# Patient Record
Sex: Female | Born: 1945 | Race: White | Hispanic: No | Smoking: Former smoker
Health system: Southern US, Community
[De-identification: ages and names within clinical notes are randomized; demographics above are authoritative.]

## PROBLEM LIST (undated history)

## (undated) DIAGNOSIS — N814 Uterovaginal prolapse, unspecified: Secondary | ICD-10-CM

## (undated) DIAGNOSIS — R42 Dizziness and giddiness: Secondary | ICD-10-CM

## (undated) DIAGNOSIS — E119 Type 2 diabetes mellitus without complications: Secondary | ICD-10-CM

## (undated) DIAGNOSIS — H8109 Meniere's disease, unspecified ear: Secondary | ICD-10-CM

## (undated) DIAGNOSIS — R413 Other amnesia: Secondary | ICD-10-CM

## (undated) DIAGNOSIS — H353290 Exudative age-related macular degeneration, unspecified eye, stage unspecified: Secondary | ICD-10-CM

## (undated) DIAGNOSIS — F419 Anxiety disorder, unspecified: Secondary | ICD-10-CM

## (undated) DIAGNOSIS — E559 Vitamin D deficiency, unspecified: Secondary | ICD-10-CM

## (undated) DIAGNOSIS — F32A Depression, unspecified: Secondary | ICD-10-CM

## (undated) DIAGNOSIS — F0390 Unspecified dementia without behavioral disturbance: Secondary | ICD-10-CM

## (undated) DIAGNOSIS — E539 Vitamin B deficiency, unspecified: Secondary | ICD-10-CM

## (undated) DIAGNOSIS — Z8639 Personal history of other endocrine, nutritional and metabolic disease: Secondary | ICD-10-CM

## (undated) DIAGNOSIS — I959 Hypotension, unspecified: Secondary | ICD-10-CM

## (undated) DIAGNOSIS — F609 Personality disorder, unspecified: Secondary | ICD-10-CM

## (undated) DIAGNOSIS — M810 Age-related osteoporosis without current pathological fracture: Secondary | ICD-10-CM

## (undated) DIAGNOSIS — Z87440 Personal history of urinary (tract) infections: Secondary | ICD-10-CM

## (undated) DIAGNOSIS — G319 Degenerative disease of nervous system, unspecified: Secondary | ICD-10-CM

## (undated) DIAGNOSIS — I739 Peripheral vascular disease, unspecified: Secondary | ICD-10-CM

## (undated) HISTORY — DX: Age-related osteoporosis without current pathological fracture: M81.0

## (undated) HISTORY — DX: Vitamin B deficiency, unspecified: E53.9

## (undated) HISTORY — DX: Exudative age-related macular degeneration, unspecified eye, stage unspecified: H35.3290

## (undated) HISTORY — DX: Unspecified dementia, unspecified severity, without behavioral disturbance, psychotic disturbance, mood disturbance, and anxiety: F03.90

## (undated) HISTORY — DX: Anxiety disorder, unspecified: F41.9

## (undated) HISTORY — DX: Vitamin D deficiency, unspecified: E55.9

## (undated) HISTORY — DX: Hypotension, unspecified: I95.9

## (undated) HISTORY — DX: Personal history of other endocrine, nutritional and metabolic disease: Z86.39

## (undated) HISTORY — DX: Personality disorder, unspecified: F60.9

## (undated) HISTORY — DX: Type 2 diabetes mellitus without complications: E11.9

## (undated) HISTORY — DX: Degenerative disease of nervous system, unspecified: G31.9

## (undated) HISTORY — DX: Meniere's disease, unspecified ear: H81.09

## (undated) HISTORY — DX: Dizziness and giddiness: R42

## (undated) HISTORY — PX: GASTRIC BYPASS: SHX52

## (undated) HISTORY — DX: Other amnesia: R41.3

## (undated) HISTORY — DX: Peripheral vascular disease, unspecified: I73.9

## (undated) HISTORY — DX: Personal history of urinary (tract) infections: Z87.440

## (undated) HISTORY — DX: Depression, unspecified: F32.A

---

## 1978-11-15 HISTORY — PX: VAGINAL HYSTERECTOMY: SUR661

## 1980-11-15 HISTORY — PX: GALLBLADDER SURGERY: SHX652

## 2017-05-28 DIAGNOSIS — H35359 Cystoid macular degeneration, unspecified eye: Secondary | ICD-10-CM | POA: Diagnosis not present

## 2017-11-25 DIAGNOSIS — M170 Bilateral primary osteoarthritis of knee: Secondary | ICD-10-CM | POA: Diagnosis not present

## 2018-02-12 ENCOUNTER — Emergency Department (HOSPITAL_COMMUNITY): Payer: Medicare Other

## 2018-02-12 ENCOUNTER — Encounter (HOSPITAL_COMMUNITY): Payer: Self-pay | Admitting: Emergency Medicine

## 2018-02-12 ENCOUNTER — Emergency Department (HOSPITAL_COMMUNITY)
Admission: EM | Admit: 2018-02-12 | Discharge: 2018-02-13 | Disposition: A | Discharge status: Home / Self Care | Payer: Medicare Other | Attending: Emergency Medicine | Admitting: Emergency Medicine

## 2018-02-12 DIAGNOSIS — R102 Pelvic and perineal pain: Secondary | ICD-10-CM | POA: Diagnosis not present

## 2018-02-12 DIAGNOSIS — F17210 Nicotine dependence, cigarettes, uncomplicated: Secondary | ICD-10-CM | POA: Insufficient documentation

## 2018-02-12 DIAGNOSIS — N952 Postmenopausal atrophic vaginitis: Secondary | ICD-10-CM | POA: Diagnosis not present

## 2018-02-12 DIAGNOSIS — N938 Other specified abnormal uterine and vaginal bleeding: Secondary | ICD-10-CM | POA: Diagnosis not present

## 2018-02-12 DIAGNOSIS — N939 Abnormal uterine and vaginal bleeding, unspecified: Secondary | ICD-10-CM

## 2018-02-12 HISTORY — DX: Uterovaginal prolapse, unspecified: N81.4

## 2018-02-12 LAB — COMPREHENSIVE METABOLIC PANEL
ALT: 14 U/L (ref 14–54)
AST: 22 U/L (ref 15–41)
Albumin: 3.3 g/dL — ABNORMAL LOW (ref 3.5–5.0)
Alkaline Phosphatase: 73 U/L (ref 38–126)
Anion gap: 13 (ref 5–15)
BUN: 12 mg/dL (ref 6–20)
CHLORIDE: 106 mmol/L (ref 101–111)
CO2: 20 mmol/L — AB (ref 22–32)
Calcium: 8.4 mg/dL — ABNORMAL LOW (ref 8.9–10.3)
Creatinine, Ser: 0.98 mg/dL (ref 0.44–1.00)
GFR, EST NON AFRICAN AMERICAN: 57 mL/min — AB (ref >60)
Glucose, Bld: 107 mg/dL — ABNORMAL HIGH (ref 65–99)
Potassium: 4.1 mmol/L (ref 3.5–5.1)
SODIUM: 139 mmol/L (ref 135–145)
Total Bilirubin: 0.6 mg/dL (ref 0.3–1.2)
Total Protein: 6.5 g/dL (ref 6.5–8.1)

## 2018-02-12 LAB — CBC
HCT: 36.9 % (ref 36.0–46.0)
Hemoglobin: 11.6 g/dL — ABNORMAL LOW (ref 12.0–15.0)
MCH: 29.4 pg (ref 26.0–34.0)
MCHC: 31.4 g/dL (ref 30.0–36.0)
MCV: 93.7 fL (ref 78.0–100.0)
PLATELETS: 232 10*3/uL (ref 150–400)
RBC: 3.94 MIL/uL (ref 3.87–5.11)
RDW: 15.5 % (ref 11.5–15.5)
WBC: 7.2 10*3/uL (ref 4.0–10.5)

## 2018-02-12 NOTE — ED Provider Notes (Signed)
[facility] DEPART[facility] Provider Note   CSN: [id number] Arrival date & time: 02/12/18  1714     History   Chief Complaint Chief Complaint  Patient presents with  . Vaginal Bleeding    HPI Joanne Jenkins is a 72 y.o. female.  Patient is a 72 year old female with past medical history of uterine prolapse with surgical correction in the past.  She presents today for evaluation of vaginal bleeding.  This started 2-1/2 days ago and is gradually worsening.  She does report some pressure in her lower abdomen but denies any dysuria or bowel complaints.  She denies any fevers or chills.  She denies any injury or trauma.  She reports going through 2 pads per day.  She describes her bleeding as "like a period".  She has been menopausal since her late 43s.  The history is provided by the patient.  Vaginal Bleeding  Primary symptoms include pelvic pain, vaginal bleeding.  Primary symptoms include no discharge. There has been no fever. This is a new problem. The current episode started 2 days ago. The problem occurs constantly. The problem has been gradually worsening. She has never menstruated. She has tried nothing for the symptoms.    Past Medical History:  Diagnosis Date  . Prolapsed uterus     There are no active problems to display for this patient.   History reviewed. No pertinent surgical history.   OB History   None      Home Medications    Prior to Admission medications   Not on File    Family History No family history on file.  Social History Social History   Tobacco Use  . Smoking status: Current Every Day Smoker    Packs/day: 0.30    Types: Cigarettes  . Smokeless tobacco: Never Used  Substance Use Topics  . Alcohol use: Yes    Comment: occ  . Drug use: Never     Allergies   Patient has no known allergies.   Review of Systems Review of Systems  Genitourinary: Positive for pelvic pain and vaginal bleeding.  All other  systems reviewed and are negative.    Physical Exam Updated Vital Signs BP (!) 148/78 (BP Location: Right Arm)   Pulse 89   Temp 98.2 F (36.8 C) (Oral)   Resp 12   Ht 5\' 9"  (1.753 m)   Wt 77.1 kg (170 lb)   SpO2 98%   BMI 25.10 kg/m   Physical Exam  Constitutional: She is oriented to person, place, and time. She appears well-developed and well-nourished. No distress.  HENT:  Head: Normocephalic and atraumatic.  Neck: Normal range of motion. Neck supple.  Cardiovascular: Normal rate and regular rhythm. Exam reveals no gallop and no friction rub.  No murmur heard. Pulmonary/Chest: Effort normal and breath sounds normal. No respiratory distress. She has no wheezes.  Abdominal: Soft. Bowel sounds are normal. She exhibits no distension and no mass. There is tenderness. There is no rebound and no guarding.  There is mild tenderness in the suprapubic region.  Musculoskeletal: Normal range of motion.  Neurological: She is alert and oriented to person, place, and time.  Skin: Skin is warm and dry. She is not diaphoretic.  Nursing note and vitals reviewed.    ED Treatments / Results  Labs (all labs ordered are listed, but only abnormal results are displayed) Labs Reviewed  CBC  COMPREHENSIVE METABOLIC PANEL    EKG None  Radiology No results found.  Procedures  Procedures (including critical care time)  Medications Ordered in ED Medications - No data to display   Initial Impression / Assessment and Plan / ED Course  I have reviewed the triage vital signs and the nursing notes.  Pertinent labs & imaging results that were available during my care of the patient were reviewed by me and considered in my medical decision making (see chart for details).  Patient presents here with complaints of vaginal bleeding.  Her surgical history is somewhat unclear as the patient is on sure as to what she has had done in the past.  She does report having uterine prolapse and some sort  of procedure to correct this.  Her ultrasound today fails to reveal a definite uterus and pelvic examination with speculum shows no cervix.  She does have friable, bleeding tissue to the inside of the vagina.  This will be treated with premarin cream as per Dr. Johnnye Sima recommendations.  Final Clinical Impressions(s) / ED Diagnoses   Final diagnoses:  None    ED Discharge Orders    None       Veryl Speak, MD 02/13/18 [phone]

## 2018-02-12 NOTE — ED Triage Notes (Addendum)
Pt to ED c/o vaginal bleeding x 2.5 days, going through 2 maxi-pads per day with lower abdominal cramping. Hx uterine prolapse and surgery to fix it. No other significant medical hx. Endorses lightheadedness if standing too fast.

## 2018-02-13 ENCOUNTER — Emergency Department (HOSPITAL_COMMUNITY): Payer: Medicare Other

## 2018-02-13 DIAGNOSIS — N939 Abnormal uterine and vaginal bleeding, unspecified: Secondary | ICD-10-CM | POA: Diagnosis not present

## 2018-02-13 MED ORDER — ESTROGENS, CONJUGATED 0.625 MG/GM VA CREA: 1.0000 | TOPICAL_CREAM | VAGINAL | 1 refills | Status: DC

## 2018-02-13 NOTE — Discharge Instructions (Signed)
Premarin cream.  Apply this intravaginally twice weekly.  Follow-up with GYN in the next 1-2 weeks.

## 2018-02-13 NOTE — ED Notes (Signed)
PT states understanding of care given, follow up care, and medication prescribed. PT ambulated from ED to car with a steady gait. 

## 2018-02-17 ENCOUNTER — Encounter: Payer: Medicare Other | Admitting: Obstetrics & Gynecology

## 2018-02-28 ENCOUNTER — Encounter: Payer: Self-pay | Admitting: Obstetrics & Gynecology

## 2018-02-28 ENCOUNTER — Ambulatory Visit (INDEPENDENT_AMBULATORY_CARE_PROVIDER_SITE_OTHER): Payer: Medicare Other | Admitting: Obstetrics & Gynecology

## 2018-02-28 VITALS — BP 146/83 | HR 93 | Ht 69.0 in | Wt 199.0 lb

## 2018-02-28 DIAGNOSIS — N952 Postmenopausal atrophic vaginitis: Secondary | ICD-10-CM

## 2018-02-28 DIAGNOSIS — N949 Unspecified condition associated with female genital organs and menstrual cycle: Secondary | ICD-10-CM

## 2018-02-28 NOTE — Progress Notes (Signed)
   Subjective:    Patient ID: Joanne Jenkins, female    DOB: 1946/05/24, 72 y.o.   MRN: [id number]  HPI  72 yo lady here with the issue of vaginal discomfort. She was prescribed vaginal estrogen after an ER visit, but she only tried it for 2 doses. Her son was worried that she would become a "bitch" if she started using estrogen.  She was seen at the ER for vaginal bleeding. She had a TVH for prolapse (her son weighed 13 pounds at birth).   Review of Systems     Objective:   Physical Exam  Breathing, conversing, and ambulating normally Well nourished, well hydrated White female, no apparent distress Abd- benign, excellent cosmetic results after a abdominoplasty (had gastric bypass and lost 200 pounds) Vagina very atrophic, some urethral prolapse (I suspect this is where the blood came from)      Assessment & Plan:  Vaginal discomfort from vaginal atrophy- recommend that she restart the vaginal estrogen 2-3 days a week Come back 4 weeks

## 2018-05-19 ENCOUNTER — Encounter: Payer: Self-pay | Admitting: Family Medicine

## 2018-05-19 ENCOUNTER — Ambulatory Visit (INDEPENDENT_AMBULATORY_CARE_PROVIDER_SITE_OTHER): Payer: Medicare Other | Admitting: Family Medicine

## 2018-05-19 VITALS — BP 122/68 | HR 78 | Temp 98.8°F | Ht 67.0 in | Wt 195.5 lb

## 2018-05-19 DIAGNOSIS — R42 Dizziness and giddiness: Secondary | ICD-10-CM | POA: Diagnosis not present

## 2018-05-19 DIAGNOSIS — Z8669 Personal history of other diseases of the nervous system and sense organs: Secondary | ICD-10-CM

## 2018-05-19 DIAGNOSIS — Z7689 Persons encountering health services in other specified circumstances: Secondary | ICD-10-CM

## 2018-05-19 MED ORDER — MECLIZINE HCL 25 MG PO TABS: 25.0000 mg | ORAL_TABLET | Freq: Three times a day (TID) | ORAL | 0 refills | Status: DC | PRN

## 2018-05-19 NOTE — Patient Instructions (Addendum)
Please call and schedule an appointment for screening mammogram. A referral is not needed.  [phone]  Stop at desk for referral to eye specialist

## 2018-05-19 NOTE — Progress Notes (Signed)
Subjective:    Patient ID: Joanne Jenkins, female    DOB: 03/14/1946, 72 y.o.   MRN: [id number]  HPI This is a 72 yo female who presents today to establish care. Moved here from [place], [place] to be closer to her son.  Has a new puppy.  Enjoys socializing with friends.  Doesn't drive much any more.   Last CPE- not sure  Mammo- not in long time Pap- hysterectomy Colonoscopy- never Tdap- unsure Flu- not ususally Eye- over due Exercise- walking  Sleep- sleeps well, good energy  Inner ear- has been taking antivert off and on for several years, has had same bottle for 4 years. Rare episodes, feels lightheaded, relieved with anitvert. No side effects.   No chest pain, no SOB, regular bowel movements, no dysuria, no frequency, no nocturia.   Has history of wet macular degeneration. Needs optho referral.   Was seen by gyn for vaginal bleeding, thought to be related to vaginal atrophy, was prescribed estrogen cream but did not get.   Son present for end of visit. He expresses concern about his mother's memory.   Past Medical History:  Diagnosis Date  . History of diabetes mellitus    resolved with weight loss  . History of UTI   . Prolapsed uterus    Past Surgical History:  Procedure Laterality Date  . CESAREAN SECTION  1980  . [place]  . GASTRIC BYPASS    . VAGINAL HYSTERECTOMY  1980   Family History  Problem Relation Age of Onset  . Diabetes Mother   . Arthritis Mother   . Heart defect Mother   . Heart disease Mother   . Drug abuse Brother   . Diabetes Brother   . Heart defect Brother   . Heart disease Brother    Social History   Tobacco Use  . Smoking status: Current Every Day Smoker    Packs/day: 0.30    Types: Cigarettes  . Smokeless tobacco: Never Used  Substance Use Topics  . Alcohol use: Yes    Comment: occ  . Drug use: Never      Review of Systems Per HPI    Objective:   Physical Exam Physical Exam  Constitutional:  Oriented to person, place, and time. She appears well-developed and well-nourished.  HENT:  Head: Normocephalic and atraumatic.  Eyes: Conjunctivae are normal.  Neck: Normal range of motion. Neck supple.  Cardiovascular: Normal rate, regular rhythm and normal heart sounds.   Pulmonary/Chest: Effort normal and breath sounds normal.  Musculoskeletal: No edema. Normal gait.  Neurological: Alert and oriented to person, place, and time.  Skin: Skin is warm and dry.  Psychiatric: Normal mood and affect. Behavior is normal. Judgment and thought content normal.  Vitals reviewed.     BP 122/68 (BP Location: Left Arm, Patient Position: Sitting, Cuff Size: Large)   Pulse 78   Temp 98.8 F (37.1 C) (Oral)   Ht 5\' 7"  (1.702 m)   Wt 195 lb 8 oz (88.7 kg)   SpO2 97%   BMI 30.62 kg/m  Wt Readings from Last 3 Encounters:  05/19/18 195 lb 8 oz (88.7 kg)  02/28/18 199 lb (90.3 kg)  02/12/18 170 lb (77.1 kg)       Assessment & Plan:  1. Encounter to establish care - will request records - follow up in 3 months, schedule AWV  2. Lightheadedness - episodes rare, discussed potential side effects of meclizine and RTC precuations - meclizine (ANTIVERT) 25  MG tablet; Take 1 tablet (25 mg total) by mouth 3 (three) times daily as needed for dizziness.  Dispense: 30 tablet; Refill: 0  3. History of macular degeneration - Ambulatory referral to Ophthalmology   Clarene Reamer, FNP-BC  [facility]  05/23/2018 4:42 PM

## 2018-05-23 DIAGNOSIS — Z8669 Personal history of other diseases of the nervous system and sense organs: Secondary | ICD-10-CM | POA: Insufficient documentation

## 2018-05-23 DIAGNOSIS — R42 Dizziness and giddiness: Secondary | ICD-10-CM | POA: Insufficient documentation

## 2018-07-13 DIAGNOSIS — H353132 Nonexudative age-related macular degeneration, bilateral, intermediate dry stage: Secondary | ICD-10-CM | POA: Diagnosis not present

## 2018-07-19 ENCOUNTER — Other Ambulatory Visit: Payer: Self-pay | Admitting: Family Medicine

## 2018-07-19 DIAGNOSIS — E669 Obesity, unspecified: Secondary | ICD-10-CM

## 2018-07-19 DIAGNOSIS — R7309 Other abnormal glucose: Secondary | ICD-10-CM

## 2018-07-19 NOTE — Progress Notes (Signed)
Lab orders placed.  

## 2018-07-20 ENCOUNTER — Ambulatory Visit (INDEPENDENT_AMBULATORY_CARE_PROVIDER_SITE_OTHER): Payer: Medicare Other

## 2018-07-20 VITALS — BP 124/90 | HR 79 | Temp 98.8°F | Ht 67.5 in | Wt 196.5 lb

## 2018-07-20 DIAGNOSIS — Z23 Encounter for immunization: Secondary | ICD-10-CM

## 2018-07-20 DIAGNOSIS — E669 Obesity, unspecified: Secondary | ICD-10-CM | POA: Diagnosis not present

## 2018-07-20 DIAGNOSIS — Z0000 Encounter for general adult medical examination without abnormal findings: Secondary | ICD-10-CM

## 2018-07-20 DIAGNOSIS — Z1159 Encounter for screening for other viral diseases: Secondary | ICD-10-CM | POA: Diagnosis not present

## 2018-07-20 DIAGNOSIS — R7309 Other abnormal glucose: Secondary | ICD-10-CM | POA: Diagnosis not present

## 2018-07-20 LAB — LIPID PANEL
CHOL/HDL RATIO: 3
Cholesterol: 150 mg/dL (ref 0–200)
HDL: 48.4 mg/dL (ref >39.00)
LDL CALC: 79 mg/dL (ref 0–99)
NonHDL: 101.71
TRIGLYCERIDES: 112 mg/dL (ref 0.0–149.0)
VLDL: 22.4 mg/dL (ref 0.0–40.0)

## 2018-07-20 LAB — HEMOGLOBIN A1C: Hgb A1c MFr Bld: 6.5 % (ref 4.6–6.5)

## 2018-07-20 NOTE — Progress Notes (Signed)
PCP notes:   Health maintenance:  Flu vaccine - administered Bone density - PCP follow-up needed Mammogram - PCP follow-up needed Colon cancer screening - PCP follow-up needed Tetanus vaccine - addressed PPSV23 - pt declined Hep C screening - completed  Abnormal screenings:   Mini-Cog score: 17/20 MMSE - Mini Mental State Exam 07/20/2018  Orientation to time 5  Orientation to Place 5  Registration 3  Attention/ Calculation 0  Recall 0  Recall-comments unable to recall 3 of 3 words  Language- name 2 objects 0  Language- repeat 1  Language- follow 3 step command 3  Language- read & follow direction 0  Write a sentence 0  Copy design 0  Total score 17     Patient concerns:   None  Nurse concerns:  None  Next PCP appt:   07/24/18 @ 830

## 2018-07-20 NOTE — Patient Instructions (Addendum)
Joanne Jenkins , Thank you for taking time to come for your Medicare Wellness Visit. I appreciate your ongoing commitment to your health goals. Please review the following plan we discussed and let me know if I can assist you in the future.   These are the goals we discussed: Goals    . Patient Stated     Starting 07/20/2018, I will continue to walk at least 60-90 minutes 3 days per week.        This is a list of the screening recommended for you and due dates:  Health Maintenance  Topic Date Due  . DEXA scan (bone density measurement)  02/14/2019*  . Mammogram  07/21/2019*  . Colon Cancer Screening  07/21/2019*  . Tetanus Vaccine  07/21/2019*  . Pneumonia vaccines (1 of 2 - PCV13) 07/21/2019*  . Flu Shot  Completed  .  Hepatitis C: One time screening is recommended by Center for Disease Control  (CDC) for  adults born from [birth year] through 1964.   Completed  *Topic was postponed. The date shown is not the original due date.    Preventive Care for Adults  A healthy lifestyle and preventive care can promote health and wellness. Preventive health guidelines for adults include the following key practices.  . A routine yearly physical is a good way to check with your health care provider about your health and preventive screening. It is a chance to share any concerns and updates on your health and to receive a thorough exam.  . Visit your dentist for a routine exam and preventive care every 6 months. Brush your teeth twice a day and floss once a day. Good oral hygiene prevents tooth decay and gum disease.  . The frequency of eye exams is based on your age, health, family medical history, use  of contact lenses, and other factors. Follow your health care provider's recommendations for frequency of eye exams.  . Eat a healthy diet. Foods like vegetables, fruits, whole grains, low-fat dairy products, and lean protein foods contain the nutrients you need without too many calories. Decrease your  intake of foods high in solid fats, added sugars, and salt. Eat the right amount of calories for you. Get information about a proper diet from your health care provider, if necessary.  . Regular physical exercise is one of the most important things you can do for your health. Most adults should get at least 150 minutes of moderate-intensity exercise (any activity that increases your heart rate and causes you to sweat) each week. In addition, most adults need muscle-strengthening exercises on 2 or more days a week.  Silver Sneakers may be a benefit available to you. To determine eligibility, you may visit the website: www.silversneakers.com or contact program at 6133919494 Mon-Fri between 8AM-8PM.   . Maintain a healthy weight. The body mass index (BMI) is a screening tool to identify possible weight problems. It provides an estimate of body fat based on height and weight. Your health care provider can find your BMI and can help you achieve or maintain a healthy weight.   For adults 20 years and older: ? A BMI below 18.5 is considered underweight. ? A BMI of 18.5 to 24.9 is normal. ? A BMI of 25 to 29.9 is considered overweight. ? A BMI of 30 and above is considered obese.   . Maintain normal blood lipids and cholesterol levels by exercising and minimizing your intake of saturated fat. Eat a balanced diet with plenty of fruit and  vegetables. Blood tests for lipids and cholesterol should begin at age 32 and be repeated every 5 years. If your lipid or cholesterol levels are high, you are over 50, or you are at high risk for heart disease, you may need your cholesterol levels checked more frequently. Ongoing high lipid and cholesterol levels should be treated with medicines if diet and exercise are not working.  . If you smoke, find out from your health care provider how to quit. If you do not use tobacco, please do not start.  . If you choose to drink alcohol, please do not consume more than 2  drinks per day. One drink is considered to be 12 ounces (355 mL) of beer, 5 ounces (148 mL) of wine, or 1.5 ounces (44 mL) of liquor.  . If you are 47-89 years old, ask your health care provider if you should take aspirin to prevent strokes.  . Use sunscreen. Apply sunscreen liberally and repeatedly throughout the day. You should seek shade when your shadow is shorter than you. Protect yourself by wearing long sleeves, pants, a wide-brimmed hat, and sunglasses year round, whenever you are outdoors.  . Once a month, do a whole body skin exam, using a mirror to look at the skin on your back. Tell your health care provider of new moles, moles that have irregular borders, moles that are larger than a pencil eraser, or moles that have changed in shape or color.

## 2018-07-20 NOTE — Progress Notes (Signed)
Subjective:   Joanne Jenkins is a 72 y.o. female who presents for an Initial Medicare Annual Wellness Visit.  Review of Systems    N/A  Cardiac Risk Factors include: advanced age (>66men, >87 women);obesity (BMI >30kg/m2);smoking/ tobacco exposure     Objective:    Today's Vitals   07/20/18 0843  BP: 124/90  Pulse: 79  Temp: 98.8 F (37.1 C)  TempSrc: Oral  SpO2: 99%  Weight: 196 lb 8 oz (89.1 kg)  Height: 5' 7.5" (1.715 m)  PainSc: 0-No pain   Body mass index is 30.32 kg/m.  Advanced Directives 07/20/2018  Does Patient Have a Medical Advance Directive? No  Would patient like information on creating a medical advance directive? Yes (MAU/Ambulatory/Procedural Areas - Information given)    Current Medications (verified) Outpatient Encounter Medications as of 07/20/2018  Medication Sig  . meclizine (ANTIVERT) 25 MG tablet Take 1 tablet (25 mg total) by mouth 3 (three) times daily as needed for dizziness.   No facility-administered encounter medications on file as of 07/20/2018.     Allergies (verified) Patient has no known allergies.   History: Past Medical History:  Diagnosis Date  . History of diabetes mellitus    resolved with weight loss  . History of UTI   . Prolapsed uterus    Past Surgical History:  Procedure Laterality Date  . CESAREAN SECTION  1980  . [place]  . GASTRIC BYPASS    . VAGINAL HYSTERECTOMY  1980   Family History  Problem Relation Age of Onset  . Diabetes Mother   . Arthritis Mother   . Heart defect Mother   . Heart disease Mother   . Drug abuse Brother   . Diabetes Brother   . Heart defect Brother   . Heart disease Brother    Social History   Socioeconomic History  . Marital status: Single    Spouse name: Not on file  . Number of children: Not on file  . Years of education: Not on file  . Highest education level: Not on file  Occupational History  . Not on file  Social Needs  . Financial resource  strain: Not on file  . Food insecurity:    Worry: Not on file    Inability: Not on file  . Transportation needs:    Medical: Not on file    Non-medical: Not on file  Tobacco Use  . Smoking status: Current Every Day Smoker    Packs/day: 0.30    Types: Cigarettes  . Smokeless tobacco: Never Used  Substance and Sexual Activity  . Alcohol use: Yes    Comment: occ  . Drug use: Never  . Sexual activity: Not Currently  Lifestyle  . Physical activity:    Days per week: Not on file    Minutes per session: Not on file  . Stress: Not on file  Relationships  . Social connections:    Talks on phone: Not on file    Gets together: Not on file    Attends religious service: Not on file    Active member of club or organization: Not on file    Attends meetings of clubs or organizations: Not on file    Relationship status: Not on file  Other Topics Concern  . Not on file  Social History Narrative  . Not on file    Tobacco Counseling Ready to quit: No Counseling given: No   Clinical Intake:  Pre-visit preparation completed: Yes  Pain :  No/denies pain Pain Score: 0-No pain     Nutritional Status: BMI > 30  Obese Nutritional Risks: None Diabetes: No  How often do you need to have someone help you when you read instructions, pamphlets, or other written materials from your doctor or pharmacy?: 1 - Never What is the last grade level you completed in school?: 12th grade + some college courses  Interpreter Needed?: No  Comments: pt is divorced and lives with son Information entered by :: [name], LPN   Activities of Daily Living In your present state of health, do you have any difficulty performing the following activities: 07/20/2018  Hearing? N  Vision? N  Difficulty concentrating or making decisions? N  Walking or climbing stairs? N  Dressing or bathing? N  Doing errands, shopping? Y  Comment limited distances only  Conservation officer, nature and eating ? N  Using the Toilet? N  In  the past six months, have you accidently leaked urine? N  Do you have problems with loss of bowel control? N  Managing your Medications? N  Managing your Finances? N  Housekeeping or managing your Housekeeping? N     Immunizations and Health Maintenance Immunization History  Administered Date(s) Administered  . Influenza, High Dose Seasonal PF 07/20/2018   There are no preventive care reminders to display for this patient.  Patient Care Team: Elby Beck, FNP as PCP - General (Nurse Practitioner)    Assessment:   This is a routine wellness examination for Joanne Jenkins.  Hearing/Vision screen  Hearing Screening   125Hz  250Hz  500Hz  1000Hz  2000Hz  3000Hz  4000Hz  6000Hz  8000Hz   Right ear:   40 40 40  40    Left ear:   40 40 40  40    Vision Screening Comments: Vision exam in Aug 2019 with Dr. Benay Pillow  Dietary issues and exercise activities discussed: Current Exercise Habits: Home exercise routine, Type of exercise: walking, Time (Minutes): 60, Frequency (Times/Week): 3, Weekly Exercise (Minutes/Week): 180, Intensity: Mild, Exercise limited by: None identified  Goals    . Patient Stated     Starting 07/20/2018, I will continue to walk at least 60-90 minutes 3 days per week.       Depression Screen PHQ 2/9 Scores 07/20/2018 05/19/2018  PHQ - 2 Score 0 0  PHQ- 9 Score 0 -    Fall Risk Fall Risk  07/20/2018 05/19/2018  Falls in the past year? No No   Cognitive Function: MMSE - Mini Mental State Exam 07/20/2018  Orientation to time 5  Orientation to Place 5  Registration 3  Attention/ Calculation 0  Recall 0  Recall-comments unable to recall 3 of 3 words  Language- name 2 objects 0  Language- repeat 1  Language- follow 3 step command 3  Language- read & follow direction 0  Write a sentence 0  Copy design 0  Total score 17     PLEASE NOTE: A Mini-Cog screen was completed. Maximum score is 20. A value of 0 denotes this part of [place] MMSE was not completed or the  patient failed this part of the Mini-Cog screening.   Mini-Cog Screening Orientation to Time - Max 5 pts Orientation to Place - Max 5 pts Registration - Max 3 pts Recall - Max 3 pts Language Repeat - Max 1 pts Language Follow 3 Step Command - Max 3 pts     Screening Tests Health Maintenance  Topic Date Due  . DEXA SCAN  02/14/2019 (Originally 08/18/2012)  . MAMMOGRAM  07/21/2019 (  Originally 08/18/1997)  . COLONOSCOPY  07/21/2019 (Originally 08/18/1997)  . TETANUS/TDAP  07/21/2019 (Originally 08/18/1966)  . PNA vac Low Risk Adult (1 of 2 - PCV13) 07/21/2019 (Originally 08/18/2012)  . INFLUENZA VACCINE  Completed  . Hepatitis C Screening  Completed     Plan:     I have personally reviewed, addressed, and noted the following in the patient's chart:  A. Medical and social history B. Use of alcohol, tobacco or illicit drugs  C. Current medications and supplements D. Functional ability and status E.  Nutritional status F.  Physical activity G. Advance directives H. List of other physicians I.  Hospitalizations, surgeries, and ER visits in previous 12 months J.  [place] to include hearing, vision, cognitive, depression L. Referrals and appointments - none  In addition, I have reviewed and discussed with patient certain preventive protocols, quality metrics, and best practice recommendations. A written personalized care plan for preventive services as well as general preventive health recommendations were provided to patient.  See attached scanned questionnaire for additional information.   Signed,   Lindell Noe, MHA, BS, LPN Health Coach

## 2018-07-21 LAB — HEPATITIS C ANTIBODY
HEP C AB: NONREACTIVE
SIGNAL TO CUT-OFF: 0.03 (ref <1.00)

## 2018-07-21 NOTE — Progress Notes (Signed)
I reviewed health advisor's note, was available for consultation, and agree with documentation and plan.  

## 2018-07-24 ENCOUNTER — Encounter: Payer: Self-pay | Admitting: Family Medicine

## 2018-07-24 ENCOUNTER — Ambulatory Visit (INDEPENDENT_AMBULATORY_CARE_PROVIDER_SITE_OTHER): Payer: Medicare Other | Admitting: Family Medicine

## 2018-07-24 VITALS — BP 112/60 | HR 88 | Temp 98.4°F | Ht 67.5 in | Wt 199.8 lb

## 2018-07-24 DIAGNOSIS — Z720 Tobacco use: Secondary | ICD-10-CM | POA: Diagnosis not present

## 2018-07-24 DIAGNOSIS — R413 Other amnesia: Secondary | ICD-10-CM

## 2018-07-24 DIAGNOSIS — Z1239 Encounter for other screening for malignant neoplasm of breast: Secondary | ICD-10-CM

## 2018-07-24 DIAGNOSIS — R7303 Prediabetes: Secondary | ICD-10-CM | POA: Diagnosis not present

## 2018-07-24 DIAGNOSIS — E2839 Other primary ovarian failure: Secondary | ICD-10-CM | POA: Diagnosis not present

## 2018-07-24 DIAGNOSIS — Z1231 Encounter for screening mammogram for malignant neoplasm of breast: Secondary | ICD-10-CM | POA: Diagnosis not present

## 2018-07-24 NOTE — Progress Notes (Signed)
Subjective:    Patient ID: Joanne Jenkins, female    DOB: 07-19-1946, 72 y.o.   MRN: [id number]  HPI This is a 72 yo female who presents today for follow up of chronic problems. Had recent Medicare AWV and lab work. She has no complaints at this time. Her son is present for part of the visit.   Last CPE-  Mammo/bone density- unsure Pap- hysterectomy Colonoscopy- never, declines colon cancer screening Tdap- unsure, declines Pneumonia- declined Flu- has had Eye- had recent visit Exercise- at least 3x a week for an hour  Lightheadedness- rare episodes, has not needed antivert recently  Vaginal bleeding- no more problems  Elevated blood sugar- has been mildly elevated for years, used to weight 400 pounds  Son concerned about short term memory loss. MMSE 17/20 recently. The patient is fairly active, does report some boredom. No hobbies. Smokes sometimes because she doesn't have anythign else to do. Son has tried to help her find a hobby. She enjoys going out shopping and walking around. Son spends time with her 3 days a week and someone is hired to take her out weekly. She attends church on Sundays.   She denies chest pain, SOB, abdominal pain, diarrhea, constipation, dysuria, muscle or joint pain.   Past Medical History:  Diagnosis Date  . History of diabetes mellitus    resolved with weight loss  . History of UTI   . Prolapsed uterus    Past Surgical History:  Procedure Laterality Date  . CESAREAN SECTION  1980  . [place]  . GASTRIC BYPASS    . VAGINAL HYSTERECTOMY  1980   Family History  Problem Relation Age of Onset  . Diabetes Mother   . Arthritis Mother   . Heart defect Mother   . Heart disease Mother   . Drug abuse Brother   . Diabetes Brother   . Heart defect Brother   . Heart disease Brother    Social History   Tobacco Use  . Smoking status: Current Every Day Smoker    Packs/day: 0.30    Types: Cigarettes  . Smokeless tobacco: Never  Used  Substance Use Topics  . Alcohol use: Yes    Comment: occ  . Drug use: Never     Review of Systems Per HPI    Objective   Physical Exam Physical Exam  Constitutional: She is oriented to person, place, and time. She appears well-developed and well-nourished. No distress.  HENT:  Head: Normocephalic and atraumatic.  Right Ear: External ear normal.  Left Ear: External ear normal.  Nose: Nose normal.  Mouth/Throat: Oropharynx is clear and moist. No oropharyngeal exudate. Entures.  Eyes: Conjunctivae are normal. Pupils are equal, round, and reactive to light.  Neck: Normal range of motion. Neck supple. No JVD present. No thyromegaly present.  Cardiovascular: Normal rate, regular rhythm, normal heart sounds and intact distal pulses.   Pulmonary/Chest: Effort normal and breath sounds normal. Right breast exhibits no inverted nipple, no mass, no nipple discharge, no skin change and no tenderness. Left breast exhibits no inverted nipple, no mass, no nipple discharge, no skin change and no tenderness. Breasts are symmetrical.  Abdominal: Soft. Bowel sounds are normal. She exhibits no distension and no mass. There is no tenderness. There is no rebound and no guarding.  Musculoskeletal: Normal range of motion. She exhibits no edema or tenderness.  Lymphadenopathy:    She has no cervical adenopathy.  Neurological: She is alert and oriented to person,  place, and time. She has normal reflexes.  Skin: Skin is warm and dry. She is not diaphoretic.  Psychiatric: She has a normal mood and affect. Her behavior is normal. Judgment and thought content normal.  Vitals reviewed.  BP 112/60 (BP Location: Right Arm, Patient Position: Sitting, Cuff Size: Large)   Pulse 88   Temp 98.4 F (36.9 C) (Oral)   Ht 5' 7.5" (1.715 m)   Wt 199 lb 12.8 oz (90.6 kg)   SpO2 96%   BMI 30.83 kg/m  Wt Readings from Last 3 Encounters:  07/24/18 199 lb 12.8 oz (90.6 kg)  07/20/18 196 lb 8 oz (89.1 kg)    05/19/18 195 lb 8 oz (88.7 kg)       Assessment & Plan:  1. Estrogen deficiency - discussed screening for osteoporosis - DG Bone Density; Future  2. Tobacco abuse - encouraged her to consider cessation, find something else to do with her hands/time - DG Bone Density; Future  3. Screening for breast cancer - provided her the number to call - MM 3D SCREEN BREAST BILATERAL; Future  4. Prediabetes - stable with good lipid panel, encouraged continued regular exercise and healthy food choices  5. Poor short term memory - this is according to patient's son - encouraged smoking cessation, regular social activity and exercise  - follow up in 1 year   Clarene Reamer, FNP-BC  [facility], [place]  07/24/2018 1:52 PM

## 2018-07-24 NOTE — Patient Instructions (Signed)
Good to see you today  Please call and schedule your mammogram and bone density study [facility]- [phone]   Preventive Care 71 Years and Older, Female Preventive care refers to lifestyle choices and visits with your health care provider that can promote health and wellness. What does preventive care include?  A yearly physical exam. This is also called an annual well check.  Dental exams once or twice a year.  Routine eye exams. Ask your health care provider how often you should have your eyes checked.  Personal lifestyle choices, including: ? Daily care of your teeth and gums. ? Regular physical activity. ? Eating a healthy diet. ? Avoiding tobacco and drug use. ? Limiting alcohol use. ? Practicing safe sex. ? Taking low-dose aspirin every day. ? Taking vitamin and mineral supplements as recommended by your health care provider. What happens during an annual well check? The services and screenings done by your health care provider during your annual well check will depend on your age, overall health, lifestyle risk factors, and family history of disease. Counseling Your health care provider may ask you questions about your:  Alcohol use.  Tobacco use.  Drug use.  Emotional well-being.  Home and relationship well-being.  Sexual activity.  Eating habits.  History of falls.  Memory and ability to understand (cognition).  Work and work Statistician.  Reproductive health.  Screening You may have the following tests or measurements:  Height, weight, and BMI.  Blood pressure.  Lipid and cholesterol levels. These may be checked every 5 years, or more frequently if you are over 71 years old.  Skin check.  Lung cancer screening. You may have this screening every year starting at age 49 if you have a 30-pack-year history of smoking and currently smoke or have quit within the past 15 years.  Fecal occult blood test (FOBT) of the stool. You may  have this test every year starting at age 92.  Flexible sigmoidoscopy or colonoscopy. You may have a sigmoidoscopy every 5 years or a colonoscopy every 10 years starting at age 50.  Hepatitis C blood test.  Hepatitis B blood test.  Sexually transmitted disease (STD) testing.  Diabetes screening. This is done by checking your blood sugar (glucose) after you have not eaten for a while (fasting). You may have this done every 1-3 years.  Bone density scan. This is done to screen for osteoporosis. You may have this done starting at age 57.  Mammogram. This may be done every 1-2 years. Talk to your health care provider about how often you should have regular mammograms.  Talk with your health care provider about your test results, treatment options, and if necessary, the need for more tests. Vaccines Your health care provider may recommend certain vaccines, such as:  Influenza vaccine. This is recommended every year.  Tetanus, diphtheria, and acellular pertussis (Tdap, Td) vaccine. You may need a Td booster every 10 years.  Varicella vaccine. You may need this if you have not been vaccinated.  Zoster vaccine. You may need this after age 83.  Measles, mumps, and rubella (MMR) vaccine. You may need at least one dose of MMR if you were born in 1957 or later. You may also need a second dose.  Pneumococcal 13-valent conjugate (PCV13) vaccine. One dose is recommended after age 80.  Pneumococcal polysaccharide (PPSV23) vaccine. One dose is recommended after age 40.  Meningococcal vaccine. You may need this if you have certain conditions.  Hepatitis A vaccine. You may  need this if you have certain conditions or if you travel or work in places where you may be exposed to hepatitis A.  Hepatitis B vaccine. You may need this if you have certain conditions or if you travel or work in places where you may be exposed to hepatitis B.  Haemophilus influenzae type b (Hib) vaccine. You may need this  if you have certain conditions.  Talk to your health care provider about which screenings and vaccines you need and how often you need them. This information is not intended to replace advice given to you by your health care provider. Make sure you discuss any questions you have with your health care provider. Document Released: 11/28/2015 Document Revised: 07/21/2016 Document Reviewed: 09/02/2015 Elsevier Interactive Patient Education  [employer].

## 2018-08-15 ENCOUNTER — Ambulatory Visit
Admission: RE | Admit: 2018-08-15 | Discharge: 2018-08-15 | Disposition: A | Discharge status: Home / Self Care | Payer: Medicare Other | Source: Ambulatory Visit | Attending: Family Medicine | Admitting: Family Medicine

## 2018-08-15 DIAGNOSIS — E2839 Other primary ovarian failure: Secondary | ICD-10-CM

## 2018-08-15 DIAGNOSIS — Z720 Tobacco use: Secondary | ICD-10-CM

## 2018-08-15 DIAGNOSIS — Z1239 Encounter for other screening for malignant neoplasm of breast: Secondary | ICD-10-CM | POA: Diagnosis not present

## 2018-08-15 DIAGNOSIS — M810 Age-related osteoporosis without current pathological fracture: Secondary | ICD-10-CM | POA: Diagnosis not present

## 2018-08-15 DIAGNOSIS — Z1231 Encounter for screening mammogram for malignant neoplasm of breast: Secondary | ICD-10-CM | POA: Diagnosis not present

## 2018-08-25 ENCOUNTER — Telehealth: Payer: Self-pay | Admitting: Family Medicine

## 2018-08-25 NOTE — Telephone Encounter (Signed)
Copied from Amanda [id number]. Topic: Quick Communication - Lab Results (Clinic Use ONLY) >> Aug 25, 2018  2:42 PM Jillyn Ledger, [place] wrote: Called patient to inform them of  lab results. When patient returns call, triage nurse may disclose results.

## 2019-04-12 ENCOUNTER — Other Ambulatory Visit: Payer: Self-pay

## 2019-04-12 ENCOUNTER — Emergency Department (HOSPITAL_COMMUNITY)
Admission: EM | Admit: 2019-04-12 | Discharge: 2019-04-13 | Disposition: A | Discharge status: Home / Self Care | Payer: Medicare Other | Attending: Emergency Medicine | Admitting: Emergency Medicine

## 2019-04-12 DIAGNOSIS — F17210 Nicotine dependence, cigarettes, uncomplicated: Secondary | ICD-10-CM | POA: Diagnosis not present

## 2019-04-12 DIAGNOSIS — Z77098 Contact with and (suspected) exposure to other hazardous, chiefly nonmedicinal, chemicals: Secondary | ICD-10-CM | POA: Insufficient documentation

## 2019-04-12 DIAGNOSIS — E119 Type 2 diabetes mellitus without complications: Secondary | ICD-10-CM | POA: Diagnosis not present

## 2019-04-12 DIAGNOSIS — R110 Nausea: Secondary | ICD-10-CM | POA: Diagnosis not present

## 2019-04-12 DIAGNOSIS — R000 Tachycardia, unspecified: Secondary | ICD-10-CM | POA: Diagnosis not present

## 2019-04-12 DIAGNOSIS — R0902 Hypoxemia: Secondary | ICD-10-CM | POA: Diagnosis not present

## 2019-04-12 LAB — URINALYSIS, ROUTINE W REFLEX MICROSCOPIC
Bilirubin Urine: NEGATIVE
Glucose, UA: NEGATIVE mg/dL
Ketones, ur: 5 mg/dL — AB
Nitrite: NEGATIVE
Protein, ur: 30 mg/dL — AB
Specific Gravity, Urine: 1.01 (ref 1.005–1.030)
pH: 5 (ref 5.0–8.0)
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
## 2019-04-12 NOTE — ED Notes (Addendum)
Social work at bedside.  
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
## 2019-04-13 MED ORDER — ONDANSETRON 4 MG PO TBDP: 4.0000 mg | ORAL_TABLET | Freq: Three times a day (TID) | ORAL | 0 refills | Status: DC | PRN

## 2019-04-13 NOTE — Discharge Instructions (Addendum)
We recommend that you follow-up with your primary care doctor if your nausea persists.  You were given a prescription for short course of Zofran to take for nausea as needed.  Return to the ED for any new or concerning symptoms.

## 2019-04-13 NOTE — ED Notes (Signed)
Discussed discharge with pt. She asked me to call [facility] to pick her up. I attempted to call [facility] but no answer.

## 2019-04-29 IMAGING — US US PELVIS COMPLETE TRANSABD/TRANSVAG
1 series · 13 of 25 positions shown · non-contrast
Comparison: None

CLINICAL DATA: Vaginal bleeding x3 days. Prolapsed uterus/bladder
with question of prior hysterectomy.

EXAM:
TRANSABDOMINAL AND TRANSVAGINAL ULTRASOUND OF PELVIS
TECHNIQUE: Both transabdominal and transvaginal ultrasound examinations of the
pelvis were performed. Transabdominal technique was performed for
global imaging of the pelvis including uterus, ovaries, adnexal
regions, and pelvic cul-de-sac. It was necessary to proceed with
endovaginal exam following the transabdominal exam to visualize the
uterus, endometrium and ovaries.

[Series 1: us pelvis complete transabd/transvag · 0.27mm/px · 13 of 33 slices shown]
[im 1/33]
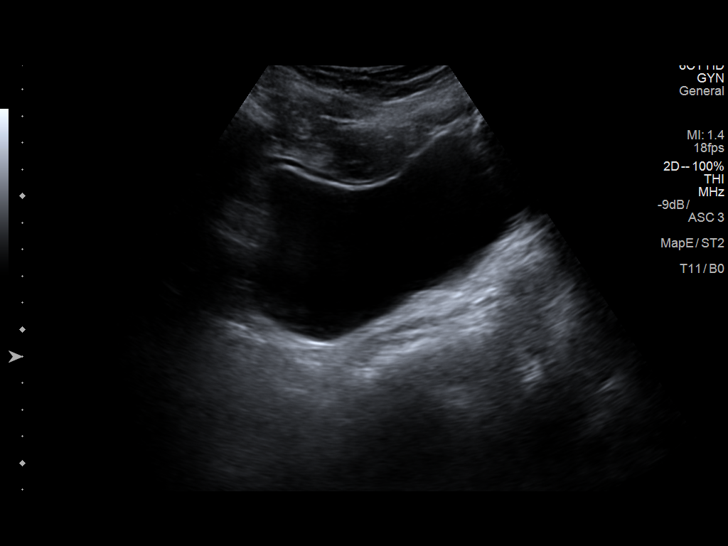
[im 3/33]
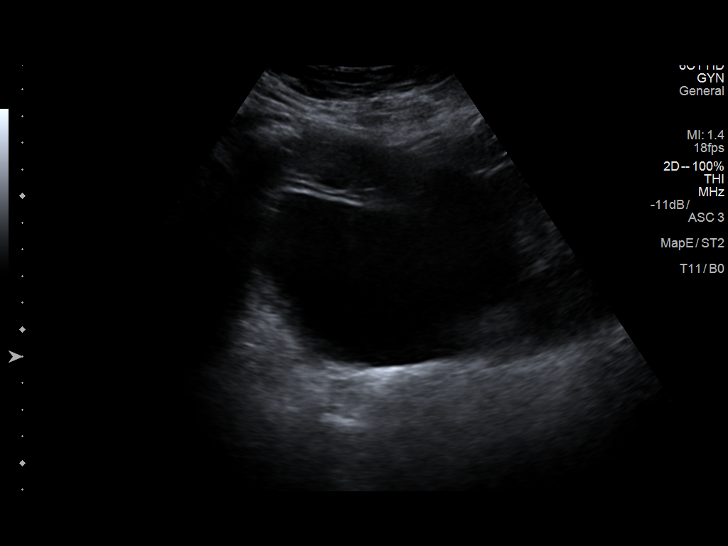
[im 6/33]
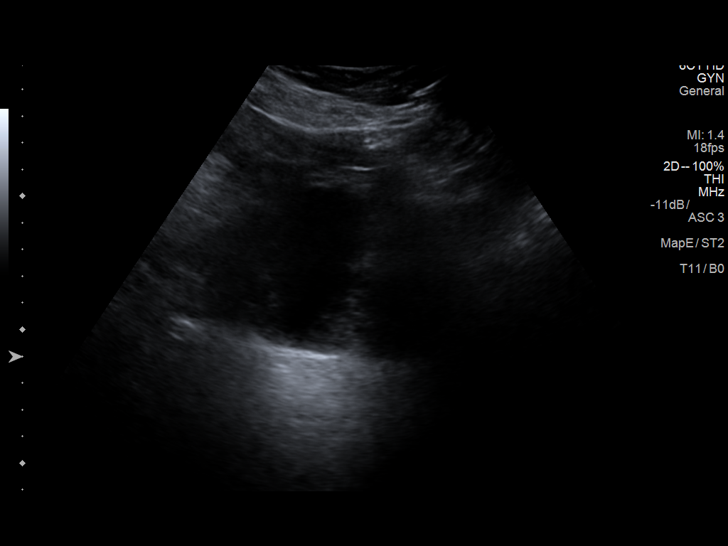
[im 9/33]
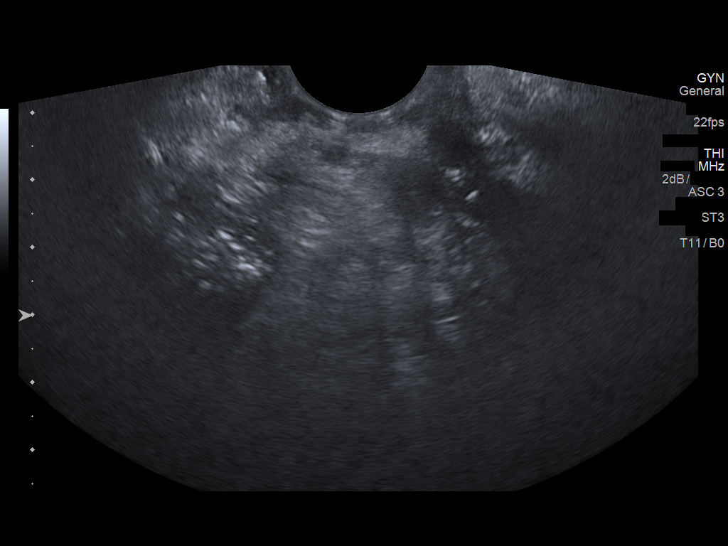
[im 11/33]
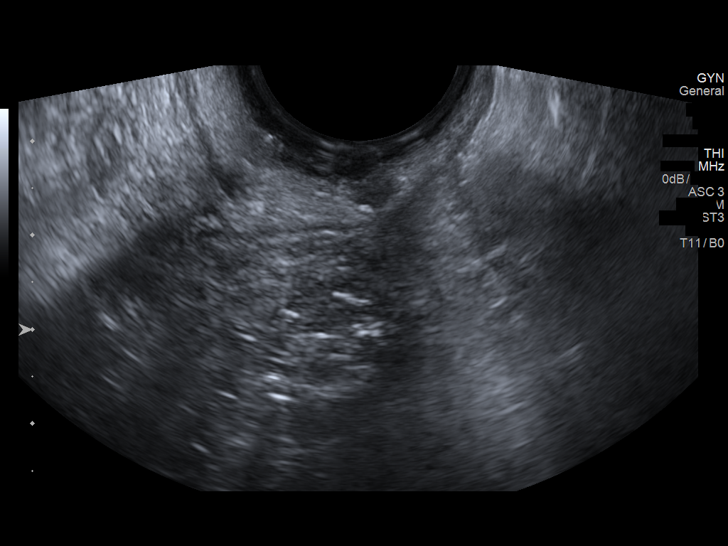
[im 14/33]
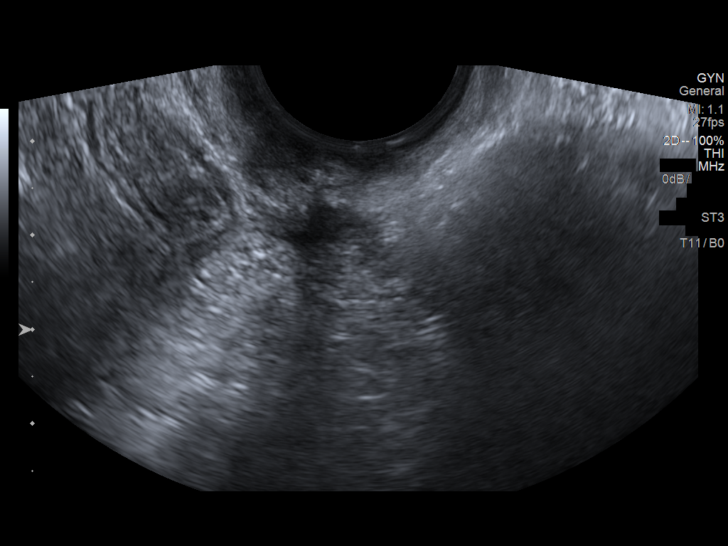
[im 17/33]
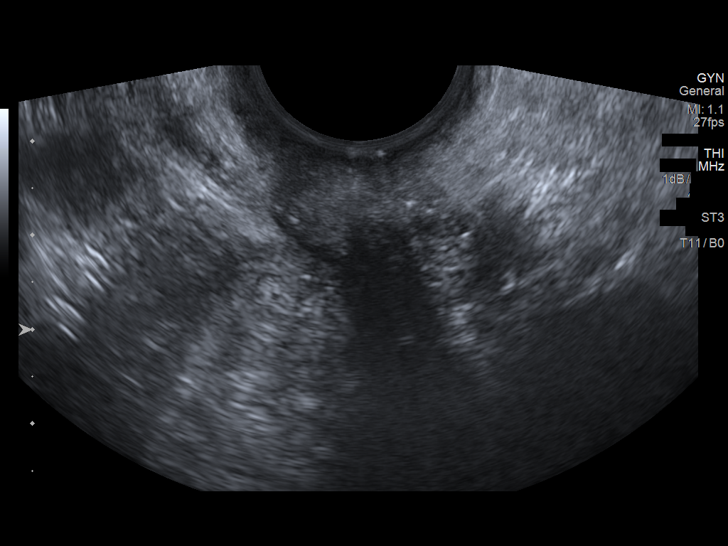
[im 19/33]
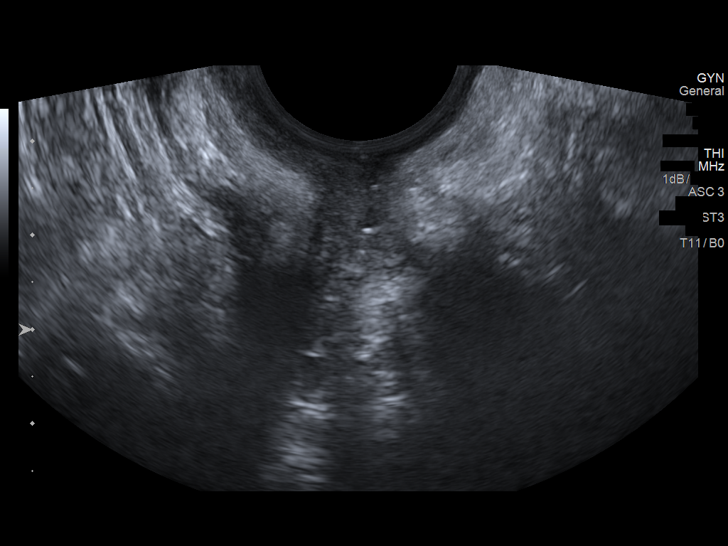
[im 22/33]
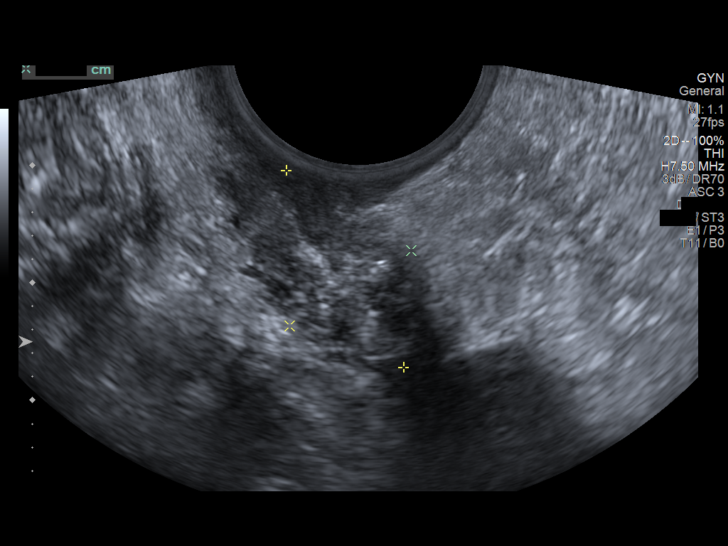
[im 25/33]
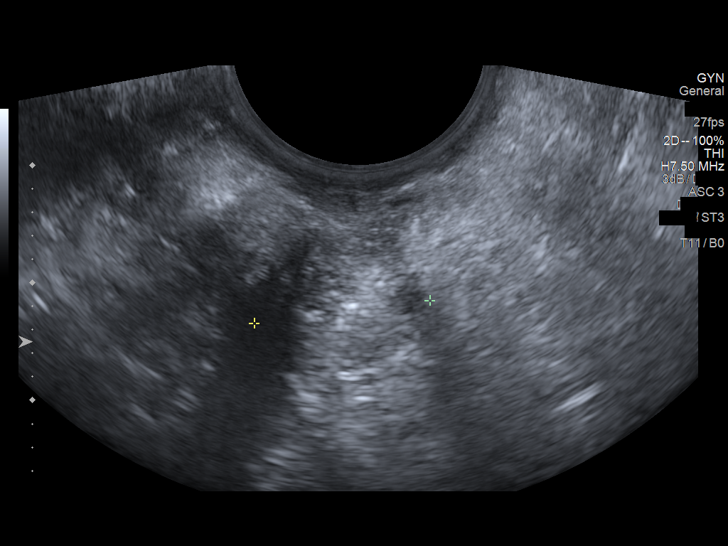
[im 27/33]
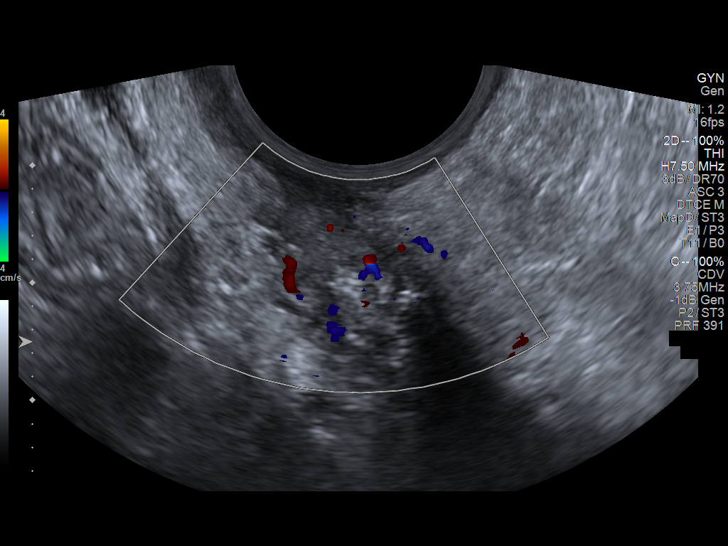
[im 30/33]
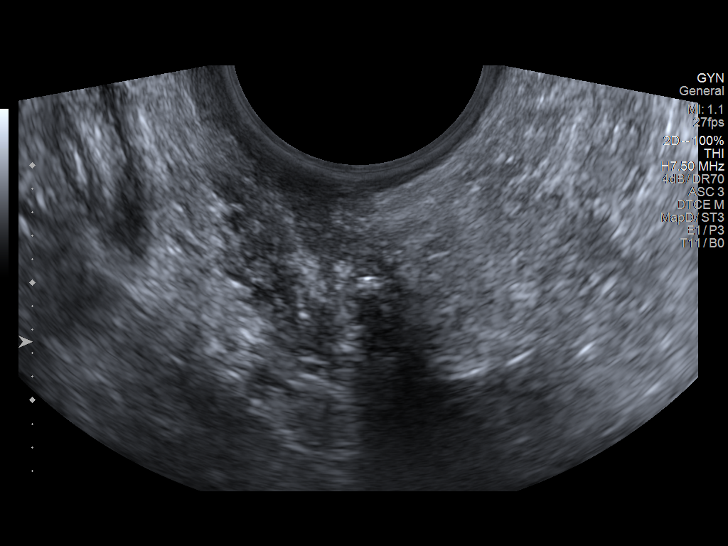
[im 33/33]
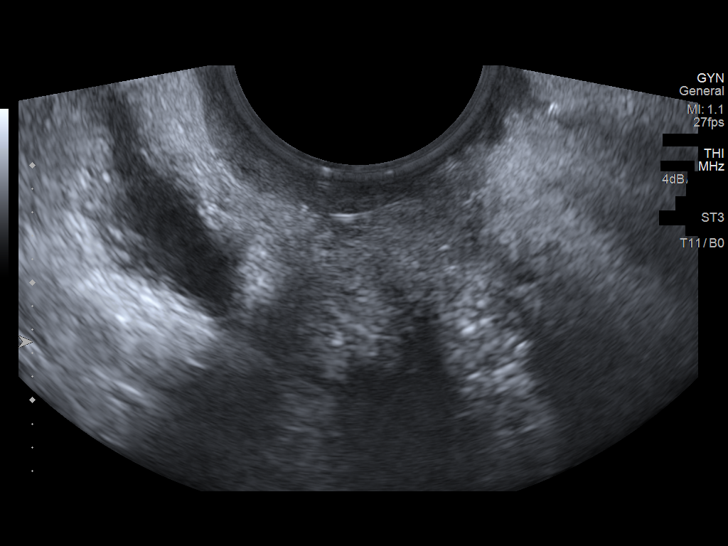

[13 of 25 positions shown; findings below may reference images not displayed]

FINDINGS: Uterus

The uterus is either markedly atrophic, consistent with age or what
is visualized may represent a vaginal cough status post
hysterectomy. This measures 2 x 1.2 x 1.5 cm.

Endometrium

Thickness: 2.1 mm.  No focal abnormality visualized.

Right ovary

Not visualized.  No adnexal mass is seen.

Left ovary

Not visualized.  No adnexal mass is seen.

Other findings

No abnormal free fluid.
IMPRESSION: There is either a markedly atrophic uterus with endometrial stripe
of approximately 2.1 mm. Otherwise, what is believed to be an
atrophic uterus may simply reflect the vaginal cuff status post
hysterectomy and what was measured as the endometrial stripe may
reflect the endocervical canal. No adnexal mass is noted. No
findings to explain the patient's vaginal bleeding.

## 2019-05-16 ENCOUNTER — Encounter: Payer: Self-pay | Admitting: Family Medicine

## 2019-05-16 ENCOUNTER — Ambulatory Visit (INDEPENDENT_AMBULATORY_CARE_PROVIDER_SITE_OTHER): Payer: Medicare Other | Admitting: Family Medicine

## 2019-05-16 ENCOUNTER — Other Ambulatory Visit: Payer: Self-pay

## 2019-05-16 VITALS — BP 105/60 | HR 68 | Temp 98.3°F

## 2019-05-16 DIAGNOSIS — R4189 Other symptoms and signs involving cognitive functions and awareness: Secondary | ICD-10-CM

## 2019-05-16 DIAGNOSIS — Z9181 History of falling: Secondary | ICD-10-CM

## 2019-05-16 NOTE — Progress Notes (Signed)
Virtual Visit via Video Note  I connected with Joanne Jenkins on 05/16/19 at 12:06 PM EDT by a video enabled telemedicine application and verified that I am speaking with the correct person using two identifiers.  Location: Patient: at home with son Joanne Jenkins Provider: [place]   I discussed the limitations of evaluation and management by telemedicine and the availability of in person appointments. The patient expressed understanding and agreed to proceed.  History of Present Illness: This is a 73 yo female whose son requests virtual visit to discuss patient attending daycare program for the elderly.  The son reports that he moved his mother and with he and his family about a week and a half ago.  She was living independently but he noticed that she was not bathing frequently and seem to be not eating regularly. Feels that her cognitive decline has been gradual and not sudden.   She has had 1 fall recently and the patient attributes this to uneven ground outside.  She had stopped smoking for about 4 weeks and then had what her son describes as a "breakdown," and started smoking again.  The son is currently working from home and he is concerned about when he goes back to work in the office that the patient will be left home alone.  He is looking into programs where she could spend the day and have regular care and socialization.  The son has been trying to help his mother get on a more regular routine with eating and sleeping since she moved in with him.  The patient tends to fall asleep in the evening on the sofa and nap some during the day.  She then wakes frequently at night. The patient states that she misses her grandchildren who she has not been able to see due to the pandemic.  She denies any pain or shortness of breath.  She states that she "feels fine."  She denies feeling down or worried but her son reports that she seems to be more anxious lately.  Past Medical History:  Diagnosis  Date  . History of diabetes mellitus    resolved with weight loss  . History of UTI   . Prolapsed uterus    Past Surgical History:  Procedure Laterality Date  . CESAREAN SECTION  1980  . [place]  . GASTRIC BYPASS    . VAGINAL HYSTERECTOMY  1980   Family History  Problem Relation Age of Onset  . Diabetes Mother   . Arthritis Mother   . Heart defect Mother   . Heart disease Mother   . Drug abuse Brother   . Diabetes Brother   . Heart defect Brother   . Heart disease Brother   . Breast cancer Neg Hx    Social History   Tobacco Use  . Smoking status: Former Smoker    Types: Cigarettes    Quit date: 05/09/2019    Years since quitting: 0.0  . Smokeless tobacco: Never Used  Substance Use Topics  . Alcohol use: Not Currently  . Drug use: Never    Observations/Objective: The patient is alert and answers questions when asked.  She does not really participate in the conversation..  Her visible skin is unremarkable.  She is normally conversive and does not appear short of breath, no audible wheeze or cough.  Her affect is very flat today.  BP 105/60   Pulse 68   Temp 98.3 F (36.8 C) (Oral)   SpO2 96%  Wt Readings from Last 3 Encounters:  07/24/18 199 lb 12.8 oz (90.6 kg)  07/20/18 196 lb 8 oz (89.1 kg)  05/19/18 195 lb 8 oz (88.7 kg)    Assessment and Plan: 1. Cognitive decline -Seems gradual in nature.  Difficult to fully assess with video visit.  The patient and her son are agreeable to coming into the office in the next 1 to 2 weeks for physical exam and labs.  2. History of recent fall -Does not seem to be unstable around her home -We will further discuss whether or not home safety evaluation would be helpful at follow-up  -Follow-up in 1 to 2 weeks for office visit and labs   Clarene Reamer, FNP-BC  [facility] at [facility] [place], [place]  05/16/2019 1:30 PM   Follow Up Instructions:   I discussed the assessment  and treatment plan with the patient. The patient was provided an opportunity to ask questions and all were answered. The patient agreed with the plan and demonstrated an understanding of the instructions.   The patient was advised to call back or seek an in-person evaluation if the symptoms worsen or if the condition fails to improve as anticipated.    Elby Beck, FNP

## 2019-05-23 ENCOUNTER — Ambulatory Visit: Payer: Medicare Other | Admitting: Family Medicine

## 2019-05-30 ENCOUNTER — Ambulatory Visit (INDEPENDENT_AMBULATORY_CARE_PROVIDER_SITE_OTHER): Payer: Medicare Other | Admitting: Family Medicine

## 2019-05-30 ENCOUNTER — Encounter: Payer: Self-pay | Admitting: Family Medicine

## 2019-05-30 ENCOUNTER — Other Ambulatory Visit: Payer: Self-pay

## 2019-05-30 VITALS — BP 102/60 | HR 87 | Temp 98.3°F | Ht 68.0 in | Wt 160.0 lb

## 2019-05-30 DIAGNOSIS — E2839 Other primary ovarian failure: Secondary | ICD-10-CM | POA: Diagnosis not present

## 2019-05-30 DIAGNOSIS — R634 Abnormal weight loss: Secondary | ICD-10-CM | POA: Diagnosis not present

## 2019-05-30 DIAGNOSIS — R413 Other amnesia: Secondary | ICD-10-CM | POA: Diagnosis not present

## 2019-05-30 DIAGNOSIS — E559 Vitamin D deficiency, unspecified: Secondary | ICD-10-CM

## 2019-05-30 DIAGNOSIS — M810 Age-related osteoporosis without current pathological fracture: Secondary | ICD-10-CM

## 2019-05-30 DIAGNOSIS — R109 Unspecified abdominal pain: Secondary | ICD-10-CM | POA: Diagnosis not present

## 2019-05-30 LAB — COMPREHENSIVE METABOLIC PANEL
ALT: 22 U/L (ref 0–35)
AST: 29 U/L (ref 0–37)
Albumin: 3 g/dL — ABNORMAL LOW (ref 3.5–5.2)
Alkaline Phosphatase: 64 U/L (ref 39–117)
BUN: 12 mg/dL (ref 6–23)
CO2: 28 mEq/L (ref 19–32)
Calcium: 7.9 mg/dL — ABNORMAL LOW (ref 8.4–10.5)
Chloride: 108 mEq/L (ref 96–112)
Creatinine, Ser: 0.82 mg/dL (ref 0.40–1.20)
GFR: 68.57 mL/min (ref >60.00)
Glucose, Bld: 109 mg/dL — ABNORMAL HIGH (ref 70–99)
Potassium: 3.1 mEq/L — ABNORMAL LOW (ref 3.5–5.1)
Sodium: 141 mEq/L (ref 135–145)
Total Bilirubin: 0.5 mg/dL (ref 0.2–1.2)
Total Protein: 5.6 g/dL — ABNORMAL LOW (ref 6.0–8.3)

## 2019-05-30 LAB — CBC WITH DIFFERENTIAL/PLATELET
Basophils Absolute: 0 10*3/uL (ref 0.0–0.1)
Basophils Relative: 0.4 % (ref 0.0–3.0)
Eosinophils Absolute: 0.1 10*3/uL (ref 0.0–0.7)
Eosinophils Relative: 2.1 % (ref 0.0–5.0)
HCT: 33.5 % — ABNORMAL LOW (ref 36.0–46.0)
Hemoglobin: 11.1 g/dL — ABNORMAL LOW (ref 12.0–15.0)
Lymphocytes Relative: 26.9 % (ref 12.0–46.0)
Lymphs Abs: 1.4 10*3/uL (ref 0.7–4.0)
MCHC: 33 g/dL (ref 30.0–36.0)
MCV: 95.8 fl (ref 78.0–100.0)
Monocytes Absolute: 0.4 10*3/uL (ref 0.1–1.0)
Monocytes Relative: 7.7 % (ref 3.0–12.0)
Neutro Abs: 3.2 10*3/uL (ref 1.4–7.7)
Neutrophils Relative %: 62.9 % (ref 43.0–77.0)
Platelets: 187 10*3/uL (ref 150.0–400.0)
RBC: 3.5 Mil/uL — ABNORMAL LOW (ref 3.87–5.11)
RDW: 16.9 % — ABNORMAL HIGH (ref 11.5–15.5)
WBC: 5.1 10*3/uL (ref 4.0–10.5)

## 2019-05-30 LAB — POC URINALSYSI DIPSTICK (AUTOMATED)
Blood, UA: NEGATIVE
Glucose, UA: NEGATIVE
Leukocytes, UA: NEGATIVE
Nitrite, UA: NEGATIVE
Protein, UA: POSITIVE — AB
Spec Grav, UA: 1.025 (ref 1.010–1.025)
Urobilinogen, UA: 1 E.U./dL
pH, UA: 5.5 (ref 5.0–8.0)

## 2019-05-30 LAB — VITAMIN B12: Vitamin B-12: 216 pg/mL (ref 211–911)

## 2019-05-30 LAB — VITAMIN D 25 HYDROXY (VIT D DEFICIENCY, FRACTURES): VITD: <7 ng/mL — ABNORMAL LOW (ref 30.00–100.00)

## 2019-05-30 LAB — TSH: TSH: 2.39 u[IU]/mL (ref 0.35–4.50)

## 2019-05-30 NOTE — Patient Instructions (Signed)
Good to see you both today  Please schedule a follow up in 3 months  I will notify you of lab results  Continue to increase protein/fat where you can.   Look at shower chair, anti slip bath mat

## 2019-05-30 NOTE — Progress Notes (Signed)
Subjective:    Patient ID: Joanne Jenkins, female    DOB: 12-12-1945, 73 y.o.   MRN: [id number]  HPI This is a 73 yo female who presents today to follow up recent virtual visit. She is accompanied by her son.   Cognitive decline- patient was living alone until a couple of weeks ago when her son realized that she was not doing well- not eating, not sleeping on regular schedule, not bathing regularly. She has done better since moving in with her son, eating better, more interactive, on a regular sleeping and eating schedule. Son reports that sometimes his mother is combative and doesn't want to bathe (she states she is always cold). She complains of smelling a noxious odor that no one else smells.   Pain- recent low back pain. Had a fall a couple of weeks ago. Hit back on coffee table. No more falls. No meds.  No complaints regarding her back x 1 week.   ROS-  No headaches, no chest pain. Poor appetite. Occasional nausea. Sensitive to smells. Sensation of foul odors that decreases her appetite. Things smell "dusty", " like burning feces." No vomiting, no diarrhea/constipaion, no abdominal pain. No dysuria, no frequency. Sleeping ok.   Past Medical History:  Diagnosis Date  . History of diabetes mellitus    resolved with weight loss  . History of UTI   . Prolapsed uterus    Past Surgical History:  Procedure Laterality Date  . CESAREAN SECTION  1980  . [place]  . GASTRIC BYPASS    . VAGINAL HYSTERECTOMY  1980   Family History  Problem Relation Age of Onset  . Diabetes Mother   . Arthritis Mother   . Heart defect Mother   . Heart disease Mother   . Drug abuse Brother   . Diabetes Brother   . Heart defect Brother   . Heart disease Brother   . Breast cancer Neg Hx    Social History   Tobacco Use  . Smoking status: Former Smoker    Types: Cigarettes    Quit date: 05/09/2019    Years since quitting: 0.0  . Smokeless tobacco: Never Used  Substance Use  Topics  . Alcohol use: Not Currently  . Drug use: Never      Review of Systems Per HPI    Objective:   Physical Exam Vitals signs reviewed.  Constitutional:      General: She is not in acute distress.    Appearance: Normal appearance. She is normal weight. She is not ill-appearing, toxic-appearing or diaphoretic.  HENT:     Head: Normocephalic and atraumatic.  Eyes:     Conjunctiva/sclera: Conjunctivae normal.  Neck:     Musculoskeletal: Normal range of motion and neck supple.  Cardiovascular:     Rate and Rhythm: Normal rate and regular rhythm.     Heart sounds: Normal heart sounds.  Pulmonary:     Effort: Pulmonary effort is normal.     Breath sounds: Normal breath sounds.  Musculoskeletal:     Right lower leg: No edema.     Left lower leg: No edema.  Skin:    General: Skin is warm and dry.  Neurological:     Mental Status: She is alert.     Comments: Answers questions, difficulty with recall.   Psychiatric:        Mood and Affect: Mood normal.     Comments: She obtained sample of feces when asked to provide a urine  sample.        BP 102/60   Pulse 87   Temp 98.3 F (36.8 C)   Ht 5\' 8"  (1.727 m)   Wt 160 lb (72.6 kg)   SpO2 98%   BMI 24.33 kg/m  Wt Readings from Last 3 Encounters:  05/30/19 160 lb (72.6 kg)  07/24/18 199 lb 12.8 oz (90.6 kg)  07/20/18 196 lb 8 oz (89.1 kg)   MMSE - Mini Mental State Exam 05/30/2019 07/20/2018  Orientation to time 1 5  Orientation to Place 3 5  Registration 3 3  Attention/ Calculation 4 0  Recall 0 0  Recall-comments - unable to recall 3 of 3 words  Language- name 2 objects 2 0  Language- repeat 1 1  Language- follow 3 step command 3 3  Language- read & follow direction 1 0  Write a sentence 1 0  Copy design 0 0  Total score 19 17       Assessment & Plan:  1. Unexplained weight loss - per patient's son, she is eating better now that she is living with him. He will get a scale to check her weight.  - CBC with  Differential - Comprehensive metabolic panel - Vitamin P10 - VITAMIN D 25 Hydroxy (Vit-D Deficiency, Fractures) - TSH  2. Memory loss - will check labs, has definitely had decline since I first saw her 9/19, consider MRI - discussed safety with patient's son- shower chair, non slip flooring. He is currently working from home but is considering day programs when he has to return to his workplace.  - CBC with Differential - Comprehensive metabolic panel - Vitamin R15 - VITAMIN D 25 Hydroxy (Vit-D Deficiency, Fractures) - TSH  3. Flank pain - POCT Urinalysis Dipstick (Automated)  4. Estrogen deficiency - VITAMIN D 25 Hydroxy (Vit-D Deficiency, Fractures)  5. Age-related osteoporosis without current pathological fracture - VITAMIN D 25 Hydroxy (Vit-D Deficiency, Fractures)  - follow up is on file for 2 months  Clarene Reamer, FNP-BC  [facility] at [facility] [place]  05/30/2019 5:29 PM

## 2019-06-06 MED ORDER — VITAMIN D3 1.25 MG (50000 UT) PO TABS: 1.0000 | ORAL_TABLET | ORAL | 3 refills | Status: DC

## 2019-06-06 NOTE — Addendum Note (Signed)
Addended by: Clarene Reamer B on: 06/06/2019 08:04 AM   Modules accepted: Orders

## 2019-07-10 ENCOUNTER — Encounter: Payer: Self-pay | Admitting: Family Medicine

## 2019-07-10 NOTE — Telephone Encounter (Signed)
Please advise Joanne Jenkins

## 2019-07-11 ENCOUNTER — Other Ambulatory Visit: Payer: Self-pay | Admitting: Family Medicine

## 2019-07-11 MED ORDER — MIRTAZAPINE 15 MG PO TABS: 15.0000 mg | ORAL_TABLET | Freq: Every day | ORAL | 2 refills | Status: DC

## 2019-07-12 ENCOUNTER — Other Ambulatory Visit: Payer: Self-pay | Admitting: Family Medicine

## 2019-07-12 DIAGNOSIS — R413 Other amnesia: Secondary | ICD-10-CM

## 2019-07-12 DIAGNOSIS — R4189 Other symptoms and signs involving cognitive functions and awareness: Secondary | ICD-10-CM

## 2019-07-12 DIAGNOSIS — M810 Age-related osteoporosis without current pathological fracture: Secondary | ICD-10-CM

## 2019-07-12 DIAGNOSIS — E559 Vitamin D deficiency, unspecified: Secondary | ICD-10-CM

## 2019-07-12 MED ORDER — VITAMIN D3 1.25 MG (50000 UT) PO TABS: 1.0000 | ORAL_TABLET | ORAL | 3 refills | Status: AC

## 2019-07-13 ENCOUNTER — Other Ambulatory Visit: Payer: Self-pay | Admitting: Family Medicine

## 2019-07-13 DIAGNOSIS — R4189 Other symptoms and signs involving cognitive functions and awareness: Secondary | ICD-10-CM

## 2019-07-13 DIAGNOSIS — R413 Other amnesia: Secondary | ICD-10-CM

## 2019-07-25 ENCOUNTER — Ambulatory Visit
Admission: RE | Admit: 2019-07-25 | Discharge: 2019-07-25 | Disposition: A | Discharge status: Home / Self Care | Payer: Medicare Other | Source: Ambulatory Visit | Attending: Family Medicine | Admitting: Family Medicine

## 2019-07-25 ENCOUNTER — Other Ambulatory Visit: Payer: Self-pay

## 2019-07-25 DIAGNOSIS — R413 Other amnesia: Secondary | ICD-10-CM | POA: Diagnosis not present

## 2019-07-25 DIAGNOSIS — R4189 Other symptoms and signs involving cognitive functions and awareness: Secondary | ICD-10-CM | POA: Diagnosis not present

## 2019-07-26 ENCOUNTER — Other Ambulatory Visit: Payer: Medicare Other

## 2019-07-27 ENCOUNTER — Other Ambulatory Visit: Payer: Medicare Other

## 2019-08-01 ENCOUNTER — Ambulatory Visit (INDEPENDENT_AMBULATORY_CARE_PROVIDER_SITE_OTHER): Payer: Medicare Other

## 2019-08-01 DIAGNOSIS — Z0000 Encounter for general adult medical examination without abnormal findings: Secondary | ICD-10-CM | POA: Diagnosis not present

## 2019-08-01 NOTE — Progress Notes (Signed)
PCP notes: none  Health Maintenance: Patient wants to discuss options for completing her colorectal screening.  Patient would like to receive her flu, prevnar 13 and Tdap vaccines at her visit on 08/03/2019. Patient declined Shingrix.     Abnormal Screenings: none   Patient concerns: none    Nurse concerns: none    Next PCP appt.: 08/03/2019 @ 8:30 am

## 2019-08-01 NOTE — Progress Notes (Signed)
Subjective:   Joanne Jenkins is a 73 y.o. female who presents for Medicare Annual (Subsequent) preventive examination.  Review of Systems:    This visit is being conducted through telemedicine due to the COVID-19 pandemic. This patient has given me verbal consent via doximity to conduct this visit, patient states they are participating from their home address. Some vital signs may be absent or patient reported.    Patient identification: identified by name, DOB, and current address  Cardiac Risk Factors include: advanced age (>59men, >2 women);sedentary lifestyle     Objective:     Vitals: There were no vitals taken for this visit.  There is no height or weight on file to calculate BMI.  Advanced Directives 08/01/2019 07/20/2018  Does Patient Have a Medical Advance Directive? No No  Would patient like information on creating a medical advance directive? No - Patient declined Yes (MAU/Ambulatory/Procedural Areas - Information given)    Tobacco Social History   Tobacco Use  Smoking Status Former Smoker  . Types: Cigarettes  . Quit date: 05/09/2019  . Years since quitting: 0.2  Smokeless Tobacco Never Used     Counseling given: Not Answered   Clinical Intake:  Pre-visit preparation completed: Yes  Pain : No/denies pain     Nutritional Risks: None  How often do you need to have someone help you when you read instructions, pamphlets, or other written materials from your doctor or pharmacy?: 1 - Never What is the last grade level you completed in school?: high school  Interpreter Needed?: No  Information entered by :: CJohnson, LPN  Past Medical History:  Diagnosis Date  . History of diabetes mellitus    resolved with weight loss  . History of UTI   . Prolapsed uterus    Past Surgical History:  Procedure Laterality Date  . CESAREAN SECTION  1980  . [place]  . GASTRIC BYPASS    . VAGINAL HYSTERECTOMY  1980   Family History  Problem  Relation Age of Onset  . Diabetes Mother   . Arthritis Mother   . Heart defect Mother   . Heart disease Mother   . Drug abuse Brother   . Diabetes Brother   . Heart defect Brother   . Heart disease Brother   . Breast cancer Neg Hx    Social History   Socioeconomic History  . Marital status: Single    Spouse name: Not on file  . Number of children: Not on file  . Years of education: Not on file  . Highest education level: Not on file  Occupational History  . Occupation: retired  Scientific laboratory technician  . Financial resource strain: Not hard at all  . Food insecurity    Worry: Never true    Inability: Never true  . Transportation needs    Medical: No    Non-medical: No  Tobacco Use  . Smoking status: Former Smoker    Types: Cigarettes    Quit date: 05/09/2019    Years since quitting: 0.2  . Smokeless tobacco: Never Used  Substance and Sexual Activity  . Alcohol use: Not Currently  . Drug use: Never  . Sexual activity: Not Currently  Lifestyle  . Physical activity    Days per week: 0 days    Minutes per session: 0 min  . Stress: To some extent  Relationships  . Social Herbalist on phone: Not on file    Gets together: Not on file  Attends religious service: Not on file    Active member of club or organization: Not on file    Attends meetings of clubs or organizations: Not on file    Relationship status: Not on file  Other Topics Concern  . Not on file  Social History Narrative  . Not on file    Outpatient Encounter Medications as of 08/01/2019  Medication Sig  . Cholecalciferol (VITAMIN D3) 1.25 MG (50000 UT) TABS Take 1 tablet by mouth every 7 (seven) days.  . meclizine (ANTIVERT) 25 MG tablet Take 1 tablet (25 mg total) by mouth 3 (three) times daily as needed for dizziness.  . mirtazapine (REMERON) 15 MG tablet Take 1 tablet (15 mg total) by mouth at bedtime. (Patient not taking: Reported on 08/01/2019)   No facility-administered encounter medications on  file as of 08/01/2019.     Activities of Daily Living In your present state of health, do you have any difficulty performing the following activities: 08/01/2019  Hearing? N  Vision? N  Difficulty concentrating or making decisions? Y  Comment cognitive decline  Walking or climbing stairs? N  Dressing or bathing? Y  Comment son helps  Doing errands, shopping? Y  Comment son helps  Conservation officer, nature and eating ? Y  Comment son helps  Using the Toilet? N  In the past six months, have you accidently leaked urine? N  Do you have problems with loss of bowel control? N  Managing your Medications? Y  Comment son helps  Managing your Finances? Y  Comment son helps  Housekeeping or managing your Housekeeping? Y  Comment son helps  Some recent data might be hidden    Patient Care Team: Elby Beck, FNP as PCP - General (Nurse Practitioner)    Assessment:   This is a routine wellness examination for [place].  Exercise Activities and Dietary recommendations Current Exercise Habits: The patient does not participate in regular exercise at present, Exercise limited by: Other - see comments(cognitive decline)  Goals    . Patient Stated     Starting 07/20/2018, I will continue to walk at least 60-90 minutes 3 days per week.     . Patient Stated     08/01/2019, Patient states that she wants to see her grandchildren before she dies.        Fall Risk Fall Risk  08/01/2019 07/20/2018 05/19/2018  Falls in the past year? 1 No No  Number falls in past yr: 0 - -  Injury with Fall? 0 - -  Risk for fall due to : Medication side effect;Impaired balance/gait - -  Follow up Falls evaluation completed;Falls prevention discussed - -   Is the patient's home free of loose throw rugs in walkways, pet beds, electrical cords, etc?   yes      Grab bars in the bathroom? yes      Handrails on the stairs?   yes      Adequate lighting?   yes  Timed Get Up and Go performed: n/a  Depression Screen PHQ  2/9 Scores 08/01/2019 07/20/2018 05/19/2018  PHQ - 2 Score 0 0 0  PHQ- 9 Score 0 0 -     Cognitive Function MMSE - Mini Mental State Exam 08/01/2019 05/30/2019 07/20/2018  Not completed: Unable to complete - -  Orientation to time - 1 5  Orientation to Place - 3 5  Registration - 3 3  Attention/ Calculation - 4 0  Recall - 0 0  Recall-comments - -  unable to recall 3 of 3 words  Language- name 2 objects - 2 0  Language- repeat - 1 1  Language- follow 3 step command - 3 3  Language- read & follow direction - 1 0  Write a sentence - 1 0  Copy design - 0 0  Total score - 19 17     Mini Cog  Mini-Cog screen was not completed due to patient's cognitive decline and memory loss. Maximum score is 22. A value of 0 denotes this part of the MMSE was not completed or the patient failed this part of the Mini-Cog screening.    Immunization History  Administered Date(s) Administered  . Influenza, High Dose Seasonal PF 07/20/2018    Qualifies for Shingles Vaccine?yes  Screening Tests Health Maintenance  Topic Date Due  . TETANUS/TDAP  08/18/1966  . COLONOSCOPY  08/18/1997  . PNA vac Low Risk Adult (1 of 2 - PCV13) 08/18/2012  . INFLUENZA VACCINE  06/16/2019  . MAMMOGRAM  08/15/2020  . DEXA SCAN  Completed  . Hepatitis C Screening  Completed    Cancer Screenings: Lung: Low Dose CT Chest recommended if Age 26-80 years, 30 pack-year currently smoking OR have quit w/in 15years. Patient does not qualify. Breast:  Up to date on Mammogram? Yes   Up to date of Bone Density/Dexa? Yes Colorectal: due, Patient will discuss options with provider at next office visit.   Additional Screenings: : Hepatitis C Screening: 07/20/18     Plan:   Patient wants to see her grandchildren before she dies.    I have personally reviewed and noted the following in the patient's chart:   . Medical and social history . Use of alcohol, tobacco or illicit drugs  . Current medications and supplements . Functional  ability and status . Nutritional status . Physical activity . Advanced directives . List of other physicians . Hospitalizations, surgeries, and ER visits in previous 12 months . Vitals . Screenings to include cognitive, depression, and falls . Referrals and appointments  In addition, I have reviewed and discussed with patient certain preventive protocols, quality metrics, and best practice recommendations. A written personalized care plan for preventive services as well as general preventive health recommendations were provided to patient.     Andrez Grime, LPN  [id number]

## 2019-08-01 NOTE — Patient Instructions (Signed)
Joanne Jenkins , Thank you for taking time to come for your Medicare Wellness Visit. I appreciate your ongoing commitment to your health goals. Please review the following plan we discussed and let me know if I can assist you in the future.   Screening recommendations/referrals: Colonoscopy: due, Patient will discuss options with provider at next office visit Mammogram: up to date, completed 08/15/2018 Bone Density: up to date, completed 08/15/2018 Recommended yearly ophthalmology/optometry visit for glaucoma screening and checkup Recommended yearly dental visit for hygiene and checkup  Vaccinations: Influenza vaccine: Will get at next office visit  Pneumococcal vaccine: will get at next office visit  Tdap vaccine: will get at next office visit  Shingles vaccine: declined    Advanced directives: Please bring a copy of your POA (Power of [place]) and/or Living Will to your next appointment once completed.  Conditions/risks identified: cognitive decline   Next appointment: 08/03/2019 @ 8:30 am    Preventive Care 72 Years and Older, Female Preventive care refers to lifestyle choices and visits with your health care provider that can promote health and wellness. What does preventive care include?  A yearly physical exam. This is also called an annual well check.  Dental exams once or twice a year.  Routine eye exams. Ask your health care provider how often you should have your eyes checked.  Personal lifestyle choices, including:  Daily care of your teeth and gums.  Regular physical activity.  Eating a healthy diet.  Avoiding tobacco and drug use.  Limiting alcohol use.  Practicing safe sex.  Taking low-dose aspirin every day.  Taking vitamin and mineral supplements as recommended by your health care provider. What happens during an annual well check? The services and screenings done by your health care provider during your annual well check will depend on your age, overall  health, lifestyle risk factors, and family history of disease. Counseling  Your health care provider may ask you questions about your:  Alcohol use.  Tobacco use.  Drug use.  Emotional well-being.  Home and relationship well-being.  Sexual activity.  Eating habits.  History of falls.  Memory and ability to understand (cognition).  Work and work Statistician.  Reproductive health. Screening  You may have the following tests or measurements:  Height, weight, and BMI.  Blood pressure.  Lipid and cholesterol levels. These may be checked every 5 years, or more frequently if you are over 41 years old.  Skin check.  Lung cancer screening. You may have this screening every year starting at age 67 if you have a 30-pack-year history of smoking and currently smoke or have quit within the past 15 years.  Fecal occult blood test (FOBT) of the stool. You may have this test every year starting at age 80.  Flexible sigmoidoscopy or colonoscopy. You may have a sigmoidoscopy every 5 years or a colonoscopy every 10 years starting at age 58.  Hepatitis C blood test.  Hepatitis B blood test.  Sexually transmitted disease (STD) testing.  Diabetes screening. This is done by checking your blood sugar (glucose) after you have not eaten for a while (fasting). You may have this done every 1-3 years.  Bone density scan. This is done to screen for osteoporosis. You may have this done starting at age 48.  Mammogram. This may be done every 1-2 years. Talk to your health care provider about how often you should have regular mammograms. Talk with your health care provider about your test results, treatment options, and if necessary,  the need for more tests. Vaccines  Your health care provider may recommend certain vaccines, such as:  Influenza vaccine. This is recommended every year.  Tetanus, diphtheria, and acellular pertussis (Tdap, Td) vaccine. You may need a Td booster every 10 years.   Zoster vaccine. You may need this after age 58.  Pneumococcal 13-valent conjugate (PCV13) vaccine. One dose is recommended after age 14.  Pneumococcal polysaccharide (PPSV23) vaccine. One dose is recommended after age 74. Talk to your health care provider about which screenings and vaccines you need and how often you need them. This information is not intended to replace advice given to you by your health care provider. Make sure you discuss any questions you have with your health care provider. Document Released: 11/28/2015 Document Revised: 07/21/2016 Document Reviewed: 09/02/2015 Elsevier Interactive Patient Education  2017 [place] Prevention in the Home Falls can cause injuries. They can happen to people of all ages. There are many things you can do to make your home safe and to help prevent falls. What can I do on the outside of my home?  Regularly fix the edges of walkways and driveways and fix any cracks.  Remove anything that might make you trip as you walk through a door, such as a raised step or threshold.  Trim any bushes or trees on the path to your home.  Use bright outdoor lighting.  Clear any walking paths of anything that might make someone trip, such as rocks or tools.  Regularly check to see if handrails are loose or broken. Make sure that both sides of any steps have handrails.  Any raised decks and porches should have guardrails on the edges.  Have any leaves, snow, or ice cleared regularly.  Use sand or salt on walking paths during winter.  Clean up any spills in your garage right away. This includes oil or grease spills. What can I do in the bathroom?  Use night lights.  Install grab bars by the toilet and in the tub and shower. Do not use towel bars as grab bars.  Use non-skid mats or decals in the tub or shower.  If you need to sit down in the shower, use a plastic, non-slip stool.  Keep the floor dry. Clean up any water that spills  on the floor as soon as it happens.  Remove soap buildup in the tub or shower regularly.  Attach bath mats securely with double-sided non-slip rug tape.  Do not have throw rugs and other things on the floor that can make you trip. What can I do in the bedroom?  Use night lights.  Make sure that you have a light by your bed that is easy to reach.  Do not use any sheets or blankets that are too big for your bed. They should not hang down onto the floor.  Have a firm chair that has side arms. You can use this for support while you get dressed.  Do not have throw rugs and other things on the floor that can make you trip. What can I do in the kitchen?  Clean up any spills right away.  Avoid walking on wet floors.  Keep items that you use a lot in easy-to-reach places.  If you need to reach something above you, use a strong step stool that has a grab bar.  Keep electrical cords out of the way.  Do not use floor polish or wax that makes floors slippery. If you must use  wax, use non-skid floor wax.  Do not have throw rugs and other things on the floor that can make you trip. What can I do with my stairs?  Do not leave any items on the stairs.  Make sure that there are handrails on both sides of the stairs and use them. Fix handrails that are broken or loose. Make sure that handrails are as long as the stairways.  Check any carpeting to make sure that it is firmly attached to the stairs. Fix any carpet that is loose or worn.  Avoid having throw rugs at the top or bottom of the stairs. If you do have throw rugs, attach them to the floor with carpet tape.  Make sure that you have a light switch at the top of the stairs and the bottom of the stairs. If you do not have them, ask someone to add them for you. What else can I do to help prevent falls?  Wear shoes that:  Do not have high heels.  Have rubber bottoms.  Are comfortable and fit you well.  Are closed at the toe. Do  not wear sandals.  If you use a stepladder:  Make sure that it is fully opened. Do not climb a closed stepladder.  Make sure that both sides of the stepladder are locked into place.  Ask someone to hold it for you, if possible.  Clearly mark and make sure that you can see:  Any grab bars or handrails.  First and last steps.  Where the edge of each step is.  Use tools that help you move around (mobility aids) if they are needed. These include:  Canes.  Walkers.  Scooters.  Crutches.  Turn on the lights when you go into a dark area. Replace any light bulbs as soon as they burn out.  Set up your furniture so you have a clear path. Avoid moving your furniture around.  If any of your floors are uneven, fix them.  If there are any pets around you, be aware of where they are.  Review your medicines with your doctor. Some medicines can make you feel dizzy. This can increase your chance of falling. Ask your doctor what other things that you can do to help prevent falls. This information is not intended to replace advice given to you by your health care provider. Make sure you discuss any questions you have with your health care provider. Document Released: 08/28/2009 Document Revised: 04/08/2016 Document Reviewed: 12/06/2014 Elsevier Interactive Patient Education  2017 [employer].

## 2019-08-03 ENCOUNTER — Ambulatory Visit (INDEPENDENT_AMBULATORY_CARE_PROVIDER_SITE_OTHER): Payer: Medicare Other | Admitting: Family Medicine

## 2019-08-03 ENCOUNTER — Ambulatory Visit: Payer: Medicare Other

## 2019-08-03 ENCOUNTER — Encounter: Payer: Self-pay | Admitting: Family Medicine

## 2019-08-03 ENCOUNTER — Other Ambulatory Visit: Payer: Self-pay

## 2019-08-03 VITALS — BP 98/62 | HR 73 | Temp 98.1°F | Ht 68.0 in | Wt 161.0 lb

## 2019-08-03 DIAGNOSIS — Z23 Encounter for immunization: Secondary | ICD-10-CM

## 2019-08-03 DIAGNOSIS — R4189 Other symptoms and signs involving cognitive functions and awareness: Secondary | ICD-10-CM | POA: Diagnosis not present

## 2019-08-03 DIAGNOSIS — Z1211 Encounter for screening for malignant neoplasm of colon: Secondary | ICD-10-CM

## 2019-08-03 NOTE — Progress Notes (Signed)
Subjective:    Patient ID: Joanne Jenkins, female    DOB: 01-Jun-1946, 73 y.o.   MRN: [id number]  HPI This is a 73 yo female, accompanied by her son, Herbie [place], who presents today for follow up of chronic medical problems.   Cognitive decline- two months ago started on Remeron for sleep, appetite.  She took it for a little while and son reports that she had improved sleep and appetite.  The patient has most recently been refusing to take any medication.  She reports that she does not want anything "keeping me down."  The son reports that it is a struggle to get her to take anything.  The patient's children are looking into moving her into senior living facility and the patient is very agreeable to this.  She expresses unhappiness living with her son and "being told what to do," and not having anything to do to stay busy.  She states that all she wants to do is see her grandchildren one last time.  The extended family has a beach trip planned upcoming where she will be able to see her grandchildren and visit with her grown children.  Her son reports that she sleeps a lot during the day and has disruptive sleep at night.  The patient does not agree with this.  Appetite has been okay until the last week or 2. Had CT head 07/25/2019 which showed atrophy with periventricular small vessel disease.  No acute infarct.  No mass or hemorrhage.  There were foci of arterial vascular calcification.  Depression- she states that she is very down and she just wants to see her grandchildren one more time.  She endorses looking forward to a different living situation, having activities into place of her own again.  She is not interested in talking with anyone about her feelings.  Health maintenance- patient is interested in colorectal cancer screening and has agreed to Cologuard.  Today she will have her Prevnar 13 and flu shot.  Discussed shingles vaccine and Tdap which can be obtained at her local pharmacy.  ROS- she  has regular mild headache of her right eye.  She does not wish to take anything for this.  She denies visual changes, shortness of breath, chest pain, abdominal pain, dysuria, hematuria, urinary frequency. Past Medical History:  Diagnosis Date  . History of diabetes mellitus    resolved with weight loss  . History of UTI   . Prolapsed uterus    Past Surgical History:  Procedure Laterality Date  . CESAREAN SECTION  1980  . [place]  . GASTRIC BYPASS    . VAGINAL HYSTERECTOMY  1980   Family History  Problem Relation Age of Onset  . Diabetes Mother   . Arthritis Mother   . Heart defect Mother   . Heart disease Mother   . Drug abuse Brother   . Diabetes Brother   . Heart defect Brother   . Heart disease Brother   . Breast cancer Neg Hx    Social History   Tobacco Use  . Smoking status: Former Smoker    Types: Cigarettes    Quit date: 05/09/2019    Years since quitting: 0.2  . Smokeless tobacco: Never Used  Substance Use Topics  . Alcohol use: Not Currently  . Drug use: Never      Review of Systems Per HPI    Objective:   Physical Exam Vitals signs reviewed.  Constitutional:      General:  She is not in acute distress.    Appearance: She is normal weight. She is not ill-appearing, toxic-appearing or diaphoretic.     Comments: Mildly disheveled appearing.   HENT:     Head: Normocephalic and atraumatic.     Right Ear: External ear normal.     Left Ear: External ear normal.  Eyes:     Conjunctiva/sclera: Conjunctivae normal.     Pupils: Pupils are equal, round, and reactive to light.  Cardiovascular:     Rate and Rhythm: Normal rate and regular rhythm.     Heart sounds: Normal heart sounds.  Pulmonary:     Effort: Pulmonary effort is normal.     Breath sounds: Normal breath sounds.  Musculoskeletal:     Right lower leg: No edema.     Left lower leg: No edema.  Skin:    General: Skin is warm and dry.  Neurological:     Mental Status: She is  alert. Mental status is at baseline.     Gait: Gait normal.  Psychiatric:        Mood and Affect: Affect is labile.        Speech: Speech normal.     Comments: Patient with poor eye contact.  She is intermittently inattentive.  She became angry and slightly raised her voice when discussing taking medication.  She lacks insight and does not seem to understand why she was moved in with her son for her safety.       BP 98/62 (BP Location: Left Wrist, Patient Position: Sitting, Cuff Size: Normal)   Pulse 73   Temp 98.1 F (36.7 C) (Oral)   Ht 5\' 8"  (1.727 m)   Wt 161 lb (73 kg)   SpO2 97%   BMI 24.48 kg/m  Wt Readings from Last 3 Encounters:  08/03/19 161 lb (73 kg)  05/30/19 160 lb (72.6 kg)  07/24/18 199 lb 12.8 oz (90.6 kg)       Assessment & Plan:  1. Cognitive decline -She is currently in a safe environment living with her son and family is exploring housing options so she can have more independence in a supervised environment -She has upcoming appointment with neurology -Labs 2 months ago unremarkable  2. Screening for colon cancer - Cologuard  3. Need for influenza vaccination - Flu Vaccine QUAD High Dose(Fluad)  4. Need for pneumococcal vaccination - Pneumococcal conjugate vaccine 13-valent  -Follow-up in 4 to 6 months, sooner if needed Clarene Reamer, FNP-BC  [facility], [place]  08/03/2019 9:28 AM

## 2019-08-03 NOTE — Patient Instructions (Addendum)
Good to see you today  Please follow up in 4-6 months- sooner if you need anything

## 2019-08-24 ENCOUNTER — Encounter: Payer: Self-pay | Admitting: Family Medicine

## 2019-09-03 ENCOUNTER — Encounter: Payer: Self-pay | Admitting: Family Medicine

## 2019-09-03 ENCOUNTER — Ambulatory Visit (INDEPENDENT_AMBULATORY_CARE_PROVIDER_SITE_OTHER): Payer: Medicare Other | Admitting: Neurology

## 2019-09-03 ENCOUNTER — Other Ambulatory Visit: Payer: Self-pay

## 2019-09-03 ENCOUNTER — Encounter: Payer: Self-pay | Admitting: Neurology

## 2019-09-03 VITALS — BP 101/60 | HR 65 | Temp 98.2°F | Ht 68.5 in | Wt 161.0 lb

## 2019-09-03 DIAGNOSIS — F0390 Unspecified dementia without behavioral disturbance: Secondary | ICD-10-CM

## 2019-09-03 DIAGNOSIS — R413 Other amnesia: Secondary | ICD-10-CM

## 2019-09-03 MED ORDER — DONEPEZIL HCL 5 MG PO TABS: 5.0000 mg | ORAL_TABLET | Freq: Every day | ORAL | 3 refills | Status: DC

## 2019-09-03 NOTE — Patient Instructions (Signed)
If your right hand soreness does not improve in the next day or 2, please proceed to urgent care to get it checked out.  I believe you have bruised your hand but since you are moving your fingers in your wrist okay, it may be worth monitoring your symptoms.  Please talk to your primary care nurse practitioner about starting vitamin B12 orally as you are B12 level was on the very low end of the spectrum.   For your memory loss, we can consider a medication called Aricept (generic name: donepezil). I can send a prescription to your assisted living facility if they have their own pharmacy, let me know how you would like to proceed, I can also send it to your local retail pharmacy, your son will be in touch with [place] regarding the prescription.   We will do a brain scan, called MRI and call you with the test results. We will have to schedule you for this on a separate date. This test requires authorization from your insurance, and we will take care of the insurance process.

## 2019-09-03 NOTE — Progress Notes (Signed)
Subjective:    Patient ID: Joanne Jenkins is a 73 y.o. female.  HPI     Star Age, MD, PhD [facility] [address] [place], [place] [address]  Dear Joanne Jenkins, I saw your patient, Joanne Jenkins, plan you can request in the neurologic clinic today for initial consultation of her memory loss.  The patient is accompanied by her middle son, Herbie Baltimore (has POA) today.  As you know, Ms. Heaslip is a 73 year old right-handed woman with an underlying medical history of diabetes, UTI, and depressive symptoms as well as sleep disturbance, who reports a fall earlier today.  She reports that she slipped and braced her fall with her right hand, she has no obvious swelling or bruising but reports soreness in her hands.  Her history is otherwise provided by her son, he reports that she has had memory loss for the past 4 or 5 years.  He noticed that she was having difficulty keeping up with her bills, he helped her by setting up auto pay for most bills.  She was living alone, she then transition to a smaller townhome with no yard to keep up about 2 years ago.  About 5 or 6 months ago she moved in with her son.  He noticed that she was having a difficult time keeping up with her housekeeping and had stopped cooking for herself, he was having her food delivered but she would not eat most of it, she started losing weight about a year ago, was not drinking and eating properly.  She was started recently on Remeron but is not currently taking it.  She does not except it from her son from what I understand.  He lives with his partner and they have 3 dogs in the household.  The patient has 2 other sons, one older and 1 younger than Herbie Baltimore and the oldest is her daughter who is about 6 years older than Robert.  The patient had gastric bypass surgery.  She used to take B12 and other vitamins but stopped taking those some years ago.  She retired from an Data processing manager position.   She quit smoking some 3 to 4 years ago.  She has no history of heavy alcohol use and currently does not utilize any alcohol and no daily caffeine.  Her mother lived to be into her 83s and her patient's son she had some mental health issues of behavioral issues but no diagnosis of dementia per se.  Patient's parents divorced and father was not in the picture and not much is known about his medical history.  The patient has 1 brother and had 2 half brothers, one passed away.  He is in the process of transitioning her to assisted living.  He has found a place in [place], [place].  I reviewed your office note from 08/03/2019.  She had been given Remeron to help her sleep at night.   She had a head CT without contrast on 07/25/2019 and I reviewed the results: IMPRESSION: Atrophy with periventricular small vessel disease. No acute infarct. No mass or hemorrhage.   There are foci of arterial vascular calcification.  She had blood work through your office on 05/30/2019 and I reviewed the results, B12 was on the lower end of the spectrum at 216, TSH unremarkable, vitamin D very low at less than 7, potassium low at 3.1, urinalysis did not show any evidence of infection.  Her Past Medical History Is Significant For: Past Medical History:  Diagnosis Date  . History of diabetes mellitus    resolved with weight loss  . History of UTI   . Prolapsed uterus     Her Past Surgical History Is Significant For: Past Surgical History:  Procedure Laterality Date  . CESAREAN SECTION  1980  . Narcissa  . GASTRIC BYPASS    . VAGINAL HYSTERECTOMY  1980    Her Family History Is Significant For: Family History  Problem Relation Age of Onset  . Diabetes Mother   . Arthritis Mother   . Heart defect Mother   . Heart disease Mother   . Drug abuse Brother   . Diabetes Brother   . Heart defect Brother   . Heart disease Brother   . Breast cancer Neg Hx     Her Social History Is Significant  For: Social History   Socioeconomic History  . Marital status: Single    Spouse name: Not on file  . Number of children: Not on file  . Years of education: Not on file  . Highest education level: Not on file  Occupational History  . Occupation: retired  Scientific laboratory technician  . Financial resource strain: Not hard at all  . Food insecurity    Worry: Never true    Inability: Never true  . Transportation needs    Medical: No    Non-medical: No  Tobacco Use  . Smoking status: Former Smoker    Types: Cigarettes    Quit date: 05/09/2019    Years since quitting: 0.3  . Smokeless tobacco: Never Used  Substance and Sexual Activity  . Alcohol use: Not Currently  . Drug use: Never  . Sexual activity: Not Currently  Lifestyle  . Physical activity    Days per week: 0 days    Minutes per session: 0 min  . Stress: To some extent  Relationships  . Social Herbalist on phone: Not on file    Gets together: Not on file    Attends religious service: Not on file    Active member of club or organization: Not on file    Attends meetings of clubs or organizations: Not on file    Relationship status: Not on file  Other Topics Concern  . Not on file  Social History Narrative  . Not on file    Her Allergies Are:  No Known Allergies:   Her Current Medications Are:  Outpatient Encounter Medications as of 09/03/2019  Medication Sig  . Cholecalciferol (VITAMIN D3) 1.25 MG (50000 UT) TABS Take 1 tablet by mouth every 7 (seven) days.  . meclizine (ANTIVERT) 25 MG tablet Take 1 tablet (25 mg total) by mouth 3 (three) times daily as needed for dizziness.  . [DISCONTINUED] mirtazapine (REMERON) 15 MG tablet Take 1 tablet (15 mg total) by mouth at bedtime. (Patient not taking: Reported on 08/01/2019)   No facility-administered encounter medications on file as of 09/03/2019.   : Review of Systems:  Out of a complete 14 point review of systems, all are reviewed and negative with the exception of  these symptoms as listed below: Review of Systems  Neurological:       Pt presents today to discuss her memory. Pt also had a fall today. Pt is complaining of right hand tenderness. Pt is in the process of moving to an ALF.    Objective:  Neurological Exam  Physical Exam Physical Examination:   Vitals:   09/03/19 1428  BP: 101/60  Pulse: 65  Temp: 98.2 F (36.8 C)    General Examination: The patient is a very pleasant 72 y.o. female in no acute distress. She appears Thin and frail and deconditioned, adequately groomed.   HEENT: Normocephalic, atraumatic, pupils are equal, round and reactive to light. She wears corrective eyeglasses.  Extraocular tracking is preserved, airway examination reveals moderate mouth dryness and dentures in place.  Hearing is grossly intact.  Face is symmetric with no obvious facial masking.  Neck is supple.Tongue protrudes centrally in palate elevates symmetrically.  Chest: Clear to auscultation without wheezing, rhonchi or crackles noted.  Heart: S1+S2+0, regular and normal without murmurs, rubs or gallops noted.   Abdomen: Soft, non-tender and non-distended with normal bowel sounds appreciated on auscultation.  Extremities: There is no pitting edema in the distal lower extremities bilaterally.   Skin: Warm and dry without trophic changes note.  Musculoskeletal: exam reveals no obvious joint deformities, tenderness or joint swelling or erythema. She reports soreness in her right handNo obvious deformity, no obvious bruising or swelling, good wrist flexion and extension, slightly sore with making a fist.  Neurologically:  Mental status: The patient is awake, alert and oriented in all 4 spheres. Her immediate and remote memory, attention, language skills and fund of knowledge are Impaired.  She is unable to provide her own history.  She needs redirection.  Her mood is normal, affect is blunted.   On 09/03/2019: MMSE: 13/30, CDT: 1/4, AFT:  5/min.  Cranial nerves II - XII are as described above under HEENT exam.  Motor exam: thin bulk, Global strength at least 4 out of 5, right hand strength difficult to assess due to pain reported. No tremor, Romberg Is not tested secondary to safety concerns.Fine motor skills are globally mildly impaired. Cerebellar testing: No dysmetria or intention tremor on finger to nose testing. Heel to shin is unremarkable bilaterally. There is no truncal or gait ataxia.  Sensory exam: intact to light touch in the upper and lower extremities.  Gait, station and balance: She stands With mild difficulty, she uses a cane for safety but can walk a short distance without her cane.  No shuffling, preserved arm swing but does try to hold up her right hand closer to her body.  Of note, she had after our visit an episode of fecal incontinence, she needed assistance in the bathroom to help clean up.   Assessment and Plan:    In summary, Nydia Iorio is a very pleasant 73 y.o.-year old female with an underlying medical history of diabetes, UTI, and depressive symptoms as well as sleep disturbance, who Presents for evaluation of her memory loss for over 5 years duration. Her MMSE is moderately abnormal, of note, she was unable to Draw the intersecting pentagons and was unable to draw the numbers into a face of a clock, was able to only draw the circle.  She does appear to have advanced issues, at least in the moderate range.  She may have underlying Alzheimer's dementia. I suggested we proceed with a brain MRI without contrast.  I also suggested we start low-dose Aricept generic once daily.  She is in the process of transitioning into an assisted living facility which I also favor to help keep a structured environment and medications on time.  Her son reports that she has not been taking her Remeron but she had improved with regards to depressive symptoms and her appetite when she was taking it.  We talked about the  importance of supportive  care and fall prevention.  I offered an x-ray of the right hand but she declined, they would like to monitor her symptoms, she is advised that she should monitor for swelling and for pain.  Her son was agreeable to taking her to urgent care should her symptoms in the right hand worsen.  She has no obvious immobility or deformity or swelling on examination today. I plan to see her back in 3 months, sooner if needed.  We will call in the interim with the MRI results.  I answered all the questions today and the patient and her son were in agreement. Thank you very much for allowing me to participate in the care of this nice patient. If I can be of any further assistance to you please do not hesitate to call me at [phone].  Sincerely,   Star Age, MD, PhD

## 2019-09-04 ENCOUNTER — Telehealth: Payer: Self-pay | Admitting: Neurology

## 2019-09-04 NOTE — Telephone Encounter (Signed)
Medicare order sent to [facility]. No auth they will reach out to the patient to schedule.  

## 2019-09-05 ENCOUNTER — Other Ambulatory Visit: Payer: Self-pay | Admitting: *Deleted

## 2019-09-05 ENCOUNTER — Telehealth: Payer: Self-pay | Admitting: Family Medicine

## 2019-09-05 ENCOUNTER — Encounter: Payer: Self-pay | Admitting: Family Medicine

## 2019-09-05 DIAGNOSIS — E559 Vitamin D deficiency, unspecified: Secondary | ICD-10-CM | POA: Insufficient documentation

## 2019-09-05 DIAGNOSIS — F0390 Unspecified dementia without behavioral disturbance: Secondary | ICD-10-CM | POA: Insufficient documentation

## 2019-09-05 NOTE — Telephone Encounter (Signed)
Rogue [employer] with [facility] left a voicemail wanting to make sure that the office received the FL-2 form and will be completing the form and returning to her today. Cell number [phone]

## 2019-09-05 NOTE — Telephone Encounter (Signed)
FL2 completed and given to Jewel Baize to print office notes and fax.

## 2019-09-05 NOTE — Telephone Encounter (Signed)
ATC to call son Joanne Jenkins again, left another [employer] w/ [facility] and made her aware that we have the form but need some questions answered before this can be sent back. Joanne Jenkins is going to reach out to the patient to have him contact us

## 2019-09-05 NOTE — Telephone Encounter (Signed)
Forms faxed back to Manuela Schwartz at [facility] with medications, diagnosis, current immunizations, last OV note with Debbie, Also attached Neurology's OV notes.   Will hold for confirmation before sending to scan.

## 2019-09-05 NOTE — Telephone Encounter (Signed)
LM for Tilman Neat to return call.

## 2019-09-05 NOTE — Telephone Encounter (Signed)
Joanne Jenkins is returning your call about the patient's FL2

## 2019-09-05 NOTE — Telephone Encounter (Signed)
Please call patient's son, Herbie Baltimore. I am finishing up the patient's FL2 form and wanted to clarify if patient is able to do her own bathing and dressing? Also, does he want to continue Remeron at facility since she is likely to take it there?

## 2019-09-05 NOTE — Telephone Encounter (Signed)
Spoke with Herbie Baltimore,  Dressing - she can get a little confused some days like where her legs go or how to put on a shirt. Son feels she might benefit from daily assistance.   Bathing - yes every time  Continue Remeron if no interaction with Aricept.  Neurologist just prescribed Aricept, not sure if able to take daily together.   Assisted Living is requesting this form be faxed back ASAP

## 2019-09-06 NOTE — Telephone Encounter (Signed)
Confirmation received, forms sent to scan.

## 2019-09-14 DIAGNOSIS — E559 Vitamin D deficiency, unspecified: Secondary | ICD-10-CM | POA: Diagnosis not present

## 2019-09-14 DIAGNOSIS — F329 Major depressive disorder, single episode, unspecified: Secondary | ICD-10-CM | POA: Diagnosis not present

## 2019-09-14 DIAGNOSIS — F0390 Unspecified dementia without behavioral disturbance: Secondary | ICD-10-CM | POA: Diagnosis not present

## 2019-10-12 DIAGNOSIS — F329 Major depressive disorder, single episode, unspecified: Secondary | ICD-10-CM | POA: Diagnosis not present

## 2019-10-12 DIAGNOSIS — F0390 Unspecified dementia without behavioral disturbance: Secondary | ICD-10-CM | POA: Diagnosis not present

## 2019-10-12 DIAGNOSIS — E559 Vitamin D deficiency, unspecified: Secondary | ICD-10-CM | POA: Diagnosis not present

## 2019-10-12 DIAGNOSIS — F4321 Adjustment disorder with depressed mood: Secondary | ICD-10-CM | POA: Diagnosis not present

## 2019-10-21 DIAGNOSIS — F4321 Adjustment disorder with depressed mood: Secondary | ICD-10-CM | POA: Diagnosis not present

## 2019-10-21 DIAGNOSIS — F331 Major depressive disorder, recurrent, moderate: Secondary | ICD-10-CM | POA: Diagnosis not present

## 2019-10-21 DIAGNOSIS — F0390 Unspecified dementia without behavioral disturbance: Secondary | ICD-10-CM | POA: Diagnosis not present

## 2019-10-23 DIAGNOSIS — R300 Dysuria: Secondary | ICD-10-CM | POA: Diagnosis not present

## 2019-10-29 DIAGNOSIS — E038 Other specified hypothyroidism: Secondary | ICD-10-CM | POA: Diagnosis not present

## 2019-10-29 DIAGNOSIS — E559 Vitamin D deficiency, unspecified: Secondary | ICD-10-CM | POA: Diagnosis not present

## 2019-10-29 DIAGNOSIS — Z79899 Other long term (current) drug therapy: Secondary | ICD-10-CM | POA: Diagnosis not present

## 2019-11-29 DIAGNOSIS — F4321 Adjustment disorder with depressed mood: Secondary | ICD-10-CM | POA: Diagnosis not present

## 2019-11-29 DIAGNOSIS — F0390 Unspecified dementia without behavioral disturbance: Secondary | ICD-10-CM | POA: Diagnosis not present

## 2019-12-07 DIAGNOSIS — R791 Abnormal coagulation profile: Secondary | ICD-10-CM | POA: Diagnosis not present

## 2019-12-07 DIAGNOSIS — M25559 Pain in unspecified hip: Secondary | ICD-10-CM | POA: Diagnosis not present

## 2019-12-07 DIAGNOSIS — M79605 Pain in left leg: Secondary | ICD-10-CM | POA: Diagnosis not present

## 2019-12-07 DIAGNOSIS — R531 Weakness: Secondary | ICD-10-CM | POA: Diagnosis not present

## 2019-12-07 DIAGNOSIS — R410 Disorientation, unspecified: Secondary | ICD-10-CM | POA: Diagnosis not present

## 2019-12-07 DIAGNOSIS — J9601 Acute respiratory failure with hypoxia: Secondary | ICD-10-CM | POA: Diagnosis not present

## 2019-12-07 DIAGNOSIS — Z79899 Other long term (current) drug therapy: Secondary | ICD-10-CM | POA: Diagnosis not present

## 2019-12-07 DIAGNOSIS — U071 COVID-19: Secondary | ICD-10-CM | POA: Diagnosis not present

## 2019-12-07 DIAGNOSIS — Z66 Do not resuscitate: Secondary | ICD-10-CM | POA: Diagnosis not present

## 2019-12-07 DIAGNOSIS — R0902 Hypoxemia: Secondary | ICD-10-CM | POA: Diagnosis not present

## 2019-12-07 DIAGNOSIS — R918 Other nonspecific abnormal finding of lung field: Secondary | ICD-10-CM | POA: Diagnosis not present

## 2019-12-07 DIAGNOSIS — Z043 Encounter for examination and observation following other accident: Secondary | ICD-10-CM | POA: Diagnosis not present

## 2019-12-07 DIAGNOSIS — R066 Hiccough: Secondary | ICD-10-CM | POA: Diagnosis not present

## 2019-12-07 DIAGNOSIS — E559 Vitamin D deficiency, unspecified: Secondary | ICD-10-CM | POA: Diagnosis not present

## 2019-12-07 DIAGNOSIS — D61818 Other pancytopenia: Secondary | ICD-10-CM | POA: Diagnosis not present

## 2019-12-07 DIAGNOSIS — D509 Iron deficiency anemia, unspecified: Secondary | ICD-10-CM | POA: Diagnosis not present

## 2019-12-07 DIAGNOSIS — M79604 Pain in right leg: Secondary | ICD-10-CM | POA: Diagnosis not present

## 2019-12-07 DIAGNOSIS — F0390 Unspecified dementia without behavioral disturbance: Secondary | ICD-10-CM | POA: Diagnosis not present

## 2019-12-07 DIAGNOSIS — J1282 Pneumonia due to coronavirus disease 2019: Secondary | ICD-10-CM | POA: Diagnosis not present

## 2019-12-07 DIAGNOSIS — F329 Major depressive disorder, single episode, unspecified: Secondary | ICD-10-CM | POA: Diagnosis not present

## 2019-12-08 DIAGNOSIS — M79604 Pain in right leg: Secondary | ICD-10-CM | POA: Diagnosis not present

## 2019-12-08 DIAGNOSIS — U071 COVID-19: Secondary | ICD-10-CM | POA: Diagnosis not present

## 2019-12-08 DIAGNOSIS — F0390 Unspecified dementia without behavioral disturbance: Secondary | ICD-10-CM | POA: Diagnosis not present

## 2019-12-08 DIAGNOSIS — J1282 Pneumonia due to coronavirus disease 2019: Secondary | ICD-10-CM | POA: Diagnosis not present

## 2019-12-08 DIAGNOSIS — D509 Iron deficiency anemia, unspecified: Secondary | ICD-10-CM | POA: Diagnosis not present

## 2019-12-08 DIAGNOSIS — M79605 Pain in left leg: Secondary | ICD-10-CM | POA: Diagnosis not present

## 2019-12-09 DIAGNOSIS — D509 Iron deficiency anemia, unspecified: Secondary | ICD-10-CM | POA: Diagnosis not present

## 2019-12-09 DIAGNOSIS — F0390 Unspecified dementia without behavioral disturbance: Secondary | ICD-10-CM | POA: Diagnosis not present

## 2019-12-09 DIAGNOSIS — U071 COVID-19: Secondary | ICD-10-CM | POA: Diagnosis not present

## 2019-12-09 DIAGNOSIS — J1282 Pneumonia due to coronavirus disease 2019: Secondary | ICD-10-CM | POA: Diagnosis not present

## 2019-12-10 DIAGNOSIS — J1282 Pneumonia due to coronavirus disease 2019: Secondary | ICD-10-CM | POA: Diagnosis not present

## 2019-12-10 DIAGNOSIS — D509 Iron deficiency anemia, unspecified: Secondary | ICD-10-CM | POA: Diagnosis not present

## 2019-12-10 DIAGNOSIS — F0390 Unspecified dementia without behavioral disturbance: Secondary | ICD-10-CM | POA: Diagnosis not present

## 2019-12-10 DIAGNOSIS — U071 COVID-19: Secondary | ICD-10-CM | POA: Diagnosis not present

## 2019-12-11 DIAGNOSIS — D509 Iron deficiency anemia, unspecified: Secondary | ICD-10-CM | POA: Diagnosis not present

## 2019-12-11 DIAGNOSIS — J1282 Pneumonia due to coronavirus disease 2019: Secondary | ICD-10-CM | POA: Diagnosis not present

## 2019-12-11 DIAGNOSIS — R0902 Hypoxemia: Secondary | ICD-10-CM | POA: Diagnosis not present

## 2019-12-11 DIAGNOSIS — U071 COVID-19: Secondary | ICD-10-CM | POA: Diagnosis not present

## 2019-12-11 DIAGNOSIS — F0390 Unspecified dementia without behavioral disturbance: Secondary | ICD-10-CM | POA: Diagnosis not present

## 2019-12-12 DIAGNOSIS — U071 COVID-19: Secondary | ICD-10-CM | POA: Diagnosis not present

## 2019-12-12 DIAGNOSIS — F0390 Unspecified dementia without behavioral disturbance: Secondary | ICD-10-CM | POA: Diagnosis not present

## 2019-12-12 DIAGNOSIS — R410 Disorientation, unspecified: Secondary | ICD-10-CM | POA: Diagnosis present

## 2019-12-12 DIAGNOSIS — J1282 Pneumonia due to coronavirus disease 2019: Secondary | ICD-10-CM | POA: Diagnosis not present

## 2019-12-12 DIAGNOSIS — D61818 Other pancytopenia: Secondary | ICD-10-CM | POA: Diagnosis present

## 2019-12-12 DIAGNOSIS — E559 Vitamin D deficiency, unspecified: Secondary | ICD-10-CM | POA: Diagnosis present

## 2019-12-12 DIAGNOSIS — R4182 Altered mental status, unspecified: Secondary | ICD-10-CM | POA: Diagnosis not present

## 2019-12-12 DIAGNOSIS — D509 Iron deficiency anemia, unspecified: Secondary | ICD-10-CM | POA: Diagnosis not present

## 2019-12-12 DIAGNOSIS — R279 Unspecified lack of coordination: Secondary | ICD-10-CM | POA: Diagnosis not present

## 2019-12-12 DIAGNOSIS — R066 Hiccough: Secondary | ICD-10-CM | POA: Diagnosis not present

## 2019-12-12 DIAGNOSIS — J9601 Acute respiratory failure with hypoxia: Secondary | ICD-10-CM | POA: Diagnosis present

## 2019-12-12 DIAGNOSIS — Z66 Do not resuscitate: Secondary | ICD-10-CM | POA: Diagnosis present

## 2019-12-12 DIAGNOSIS — I6782 Cerebral ischemia: Secondary | ICD-10-CM | POA: Diagnosis not present

## 2019-12-12 DIAGNOSIS — R0902 Hypoxemia: Secondary | ICD-10-CM | POA: Diagnosis not present

## 2019-12-12 DIAGNOSIS — F329 Major depressive disorder, single episode, unspecified: Secondary | ICD-10-CM | POA: Diagnosis present

## 2019-12-12 DIAGNOSIS — Z79899 Other long term (current) drug therapy: Secondary | ICD-10-CM | POA: Diagnosis not present

## 2019-12-12 DIAGNOSIS — Z743 Need for continuous supervision: Secondary | ICD-10-CM | POA: Diagnosis not present

## 2019-12-12 DIAGNOSIS — M25559 Pain in unspecified hip: Secondary | ICD-10-CM | POA: Diagnosis present

## 2019-12-13 DIAGNOSIS — F0390 Unspecified dementia without behavioral disturbance: Secondary | ICD-10-CM | POA: Diagnosis not present

## 2019-12-13 DIAGNOSIS — U071 COVID-19: Secondary | ICD-10-CM | POA: Diagnosis not present

## 2019-12-13 DIAGNOSIS — D509 Iron deficiency anemia, unspecified: Secondary | ICD-10-CM | POA: Diagnosis not present

## 2019-12-13 DIAGNOSIS — J1282 Pneumonia due to Coronavirus disease 2019: Secondary | ICD-10-CM | POA: Diagnosis not present

## 2019-12-14 DIAGNOSIS — J1282 Pneumonia due to Coronavirus disease 2019: Secondary | ICD-10-CM | POA: Diagnosis not present

## 2019-12-14 DIAGNOSIS — U071 COVID-19: Secondary | ICD-10-CM | POA: Diagnosis not present

## 2019-12-15 DIAGNOSIS — I6782 Cerebral ischemia: Secondary | ICD-10-CM | POA: Diagnosis not present

## 2019-12-15 DIAGNOSIS — U071 COVID-19: Secondary | ICD-10-CM | POA: Diagnosis not present

## 2019-12-15 DIAGNOSIS — J1282 Pneumonia due to Coronavirus disease 2019: Secondary | ICD-10-CM | POA: Diagnosis not present

## 2019-12-15 DIAGNOSIS — R4182 Altered mental status, unspecified: Secondary | ICD-10-CM | POA: Diagnosis not present

## 2019-12-16 DIAGNOSIS — U071 COVID-19: Secondary | ICD-10-CM | POA: Diagnosis not present

## 2019-12-16 DIAGNOSIS — J1282 Pneumonia due to Coronavirus disease 2019: Secondary | ICD-10-CM | POA: Diagnosis not present

## 2019-12-17 DIAGNOSIS — J1282 Pneumonia due to Coronavirus disease 2019: Secondary | ICD-10-CM | POA: Diagnosis not present

## 2019-12-17 DIAGNOSIS — U071 COVID-19: Secondary | ICD-10-CM | POA: Diagnosis not present

## 2019-12-18 DIAGNOSIS — J1282 Pneumonia due to Coronavirus disease 2019: Secondary | ICD-10-CM | POA: Diagnosis not present

## 2019-12-18 DIAGNOSIS — U071 COVID-19: Secondary | ICD-10-CM | POA: Diagnosis not present

## 2019-12-19 DIAGNOSIS — U071 COVID-19: Secondary | ICD-10-CM | POA: Diagnosis not present

## 2019-12-19 DIAGNOSIS — J1282 Pneumonia due to Coronavirus disease 2019: Secondary | ICD-10-CM | POA: Diagnosis not present

## 2019-12-20 DIAGNOSIS — U071 COVID-19: Secondary | ICD-10-CM | POA: Diagnosis not present

## 2019-12-20 DIAGNOSIS — J1282 Pneumonia due to Coronavirus disease 2019: Secondary | ICD-10-CM | POA: Diagnosis not present

## 2019-12-23 DIAGNOSIS — G3189 Other specified degenerative diseases of nervous system: Secondary | ICD-10-CM | POA: Diagnosis not present

## 2019-12-23 DIAGNOSIS — E559 Vitamin D deficiency, unspecified: Secondary | ICD-10-CM | POA: Diagnosis not present

## 2019-12-23 DIAGNOSIS — R531 Weakness: Secondary | ICD-10-CM | POA: Diagnosis not present

## 2019-12-23 DIAGNOSIS — Z9181 History of falling: Secondary | ICD-10-CM | POA: Diagnosis not present

## 2019-12-23 DIAGNOSIS — M1991 Primary osteoarthritis, unspecified site: Secondary | ICD-10-CM | POA: Diagnosis not present

## 2019-12-23 DIAGNOSIS — F0280 Dementia in other diseases classified elsewhere without behavioral disturbance: Secondary | ICD-10-CM | POA: Diagnosis not present

## 2019-12-23 DIAGNOSIS — Z8701 Personal history of pneumonia (recurrent): Secondary | ICD-10-CM | POA: Diagnosis not present

## 2019-12-23 DIAGNOSIS — D509 Iron deficiency anemia, unspecified: Secondary | ICD-10-CM | POA: Diagnosis not present

## 2019-12-23 DIAGNOSIS — I959 Hypotension, unspecified: Secondary | ICD-10-CM | POA: Diagnosis not present

## 2019-12-23 DIAGNOSIS — U071 COVID-19: Secondary | ICD-10-CM | POA: Diagnosis not present

## 2019-12-25 DIAGNOSIS — E559 Vitamin D deficiency, unspecified: Secondary | ICD-10-CM | POA: Diagnosis not present

## 2019-12-25 DIAGNOSIS — D509 Iron deficiency anemia, unspecified: Secondary | ICD-10-CM | POA: Diagnosis not present

## 2019-12-25 DIAGNOSIS — G3189 Other specified degenerative diseases of nervous system: Secondary | ICD-10-CM | POA: Diagnosis not present

## 2019-12-25 DIAGNOSIS — D508 Other iron deficiency anemias: Secondary | ICD-10-CM | POA: Diagnosis not present

## 2019-12-25 DIAGNOSIS — R531 Weakness: Secondary | ICD-10-CM | POA: Diagnosis not present

## 2019-12-25 DIAGNOSIS — F0390 Unspecified dementia without behavioral disturbance: Secondary | ICD-10-CM | POA: Diagnosis not present

## 2019-12-25 DIAGNOSIS — I959 Hypotension, unspecified: Secondary | ICD-10-CM | POA: Diagnosis not present

## 2019-12-25 DIAGNOSIS — F0280 Dementia in other diseases classified elsewhere without behavioral disturbance: Secondary | ICD-10-CM | POA: Diagnosis not present

## 2019-12-25 DIAGNOSIS — F329 Major depressive disorder, single episode, unspecified: Secondary | ICD-10-CM | POA: Diagnosis not present

## 2019-12-25 DIAGNOSIS — U071 COVID-19: Secondary | ICD-10-CM | POA: Diagnosis not present

## 2019-12-27 DIAGNOSIS — U071 COVID-19: Secondary | ICD-10-CM | POA: Diagnosis not present

## 2019-12-27 DIAGNOSIS — R531 Weakness: Secondary | ICD-10-CM | POA: Diagnosis not present

## 2019-12-27 DIAGNOSIS — F411 Generalized anxiety disorder: Secondary | ICD-10-CM | POA: Diagnosis not present

## 2019-12-27 DIAGNOSIS — F331 Major depressive disorder, recurrent, moderate: Secondary | ICD-10-CM | POA: Diagnosis not present

## 2019-12-27 DIAGNOSIS — I959 Hypotension, unspecified: Secondary | ICD-10-CM | POA: Diagnosis not present

## 2019-12-27 DIAGNOSIS — G3189 Other specified degenerative diseases of nervous system: Secondary | ICD-10-CM | POA: Diagnosis not present

## 2019-12-27 DIAGNOSIS — F0280 Dementia in other diseases classified elsewhere without behavioral disturbance: Secondary | ICD-10-CM | POA: Diagnosis not present

## 2019-12-27 DIAGNOSIS — F0391 Unspecified dementia with behavioral disturbance: Secondary | ICD-10-CM | POA: Diagnosis not present

## 2019-12-27 DIAGNOSIS — D509 Iron deficiency anemia, unspecified: Secondary | ICD-10-CM | POA: Diagnosis not present

## 2019-12-28 DIAGNOSIS — U071 COVID-19: Secondary | ICD-10-CM | POA: Diagnosis not present

## 2019-12-28 DIAGNOSIS — F0280 Dementia in other diseases classified elsewhere without behavioral disturbance: Secondary | ICD-10-CM | POA: Diagnosis not present

## 2019-12-28 DIAGNOSIS — D509 Iron deficiency anemia, unspecified: Secondary | ICD-10-CM | POA: Diagnosis not present

## 2019-12-28 DIAGNOSIS — I959 Hypotension, unspecified: Secondary | ICD-10-CM | POA: Diagnosis not present

## 2019-12-28 DIAGNOSIS — G3189 Other specified degenerative diseases of nervous system: Secondary | ICD-10-CM | POA: Diagnosis not present

## 2019-12-28 DIAGNOSIS — R531 Weakness: Secondary | ICD-10-CM | POA: Diagnosis not present

## 2019-12-31 DIAGNOSIS — Z79899 Other long term (current) drug therapy: Secondary | ICD-10-CM | POA: Diagnosis not present

## 2019-12-31 DIAGNOSIS — D518 Other vitamin B12 deficiency anemias: Secondary | ICD-10-CM | POA: Diagnosis not present

## 2019-12-31 DIAGNOSIS — E7849 Other hyperlipidemia: Secondary | ICD-10-CM | POA: Diagnosis not present

## 2019-12-31 DIAGNOSIS — E559 Vitamin D deficiency, unspecified: Secondary | ICD-10-CM | POA: Diagnosis not present

## 2019-12-31 DIAGNOSIS — E119 Type 2 diabetes mellitus without complications: Secondary | ICD-10-CM | POA: Diagnosis not present

## 2020-01-01 DIAGNOSIS — U071 COVID-19: Secondary | ICD-10-CM | POA: Diagnosis not present

## 2020-01-01 DIAGNOSIS — I959 Hypotension, unspecified: Secondary | ICD-10-CM | POA: Diagnosis not present

## 2020-01-01 DIAGNOSIS — R531 Weakness: Secondary | ICD-10-CM | POA: Diagnosis not present

## 2020-01-01 DIAGNOSIS — G3189 Other specified degenerative diseases of nervous system: Secondary | ICD-10-CM | POA: Diagnosis not present

## 2020-01-01 DIAGNOSIS — F0280 Dementia in other diseases classified elsewhere without behavioral disturbance: Secondary | ICD-10-CM | POA: Diagnosis not present

## 2020-01-01 DIAGNOSIS — D509 Iron deficiency anemia, unspecified: Secondary | ICD-10-CM | POA: Diagnosis not present

## 2020-01-02 DIAGNOSIS — G3189 Other specified degenerative diseases of nervous system: Secondary | ICD-10-CM | POA: Diagnosis not present

## 2020-01-02 DIAGNOSIS — D509 Iron deficiency anemia, unspecified: Secondary | ICD-10-CM | POA: Diagnosis not present

## 2020-01-02 DIAGNOSIS — R531 Weakness: Secondary | ICD-10-CM | POA: Diagnosis not present

## 2020-01-02 DIAGNOSIS — U071 COVID-19: Secondary | ICD-10-CM | POA: Diagnosis not present

## 2020-01-02 DIAGNOSIS — F0280 Dementia in other diseases classified elsewhere without behavioral disturbance: Secondary | ICD-10-CM | POA: Diagnosis not present

## 2020-01-02 DIAGNOSIS — I959 Hypotension, unspecified: Secondary | ICD-10-CM | POA: Diagnosis not present

## 2020-01-03 DIAGNOSIS — U071 COVID-19: Secondary | ICD-10-CM | POA: Diagnosis not present

## 2020-01-03 DIAGNOSIS — F0280 Dementia in other diseases classified elsewhere without behavioral disturbance: Secondary | ICD-10-CM | POA: Diagnosis not present

## 2020-01-03 DIAGNOSIS — G3189 Other specified degenerative diseases of nervous system: Secondary | ICD-10-CM | POA: Diagnosis not present

## 2020-01-03 DIAGNOSIS — R531 Weakness: Secondary | ICD-10-CM | POA: Diagnosis not present

## 2020-01-04 DIAGNOSIS — G3189 Other specified degenerative diseases of nervous system: Secondary | ICD-10-CM | POA: Diagnosis not present

## 2020-01-04 DIAGNOSIS — I959 Hypotension, unspecified: Secondary | ICD-10-CM | POA: Diagnosis not present

## 2020-01-04 DIAGNOSIS — D509 Iron deficiency anemia, unspecified: Secondary | ICD-10-CM | POA: Diagnosis not present

## 2020-01-04 DIAGNOSIS — R531 Weakness: Secondary | ICD-10-CM | POA: Diagnosis not present

## 2020-01-04 DIAGNOSIS — U071 COVID-19: Secondary | ICD-10-CM | POA: Diagnosis not present

## 2020-01-04 DIAGNOSIS — F0280 Dementia in other diseases classified elsewhere without behavioral disturbance: Secondary | ICD-10-CM | POA: Diagnosis not present

## 2020-01-07 DIAGNOSIS — U071 COVID-19: Secondary | ICD-10-CM | POA: Diagnosis not present

## 2020-01-07 DIAGNOSIS — R531 Weakness: Secondary | ICD-10-CM | POA: Diagnosis not present

## 2020-01-07 DIAGNOSIS — I959 Hypotension, unspecified: Secondary | ICD-10-CM | POA: Diagnosis not present

## 2020-01-07 DIAGNOSIS — F0280 Dementia in other diseases classified elsewhere without behavioral disturbance: Secondary | ICD-10-CM | POA: Diagnosis not present

## 2020-01-07 DIAGNOSIS — D509 Iron deficiency anemia, unspecified: Secondary | ICD-10-CM | POA: Diagnosis not present

## 2020-01-07 DIAGNOSIS — G3189 Other specified degenerative diseases of nervous system: Secondary | ICD-10-CM | POA: Diagnosis not present

## 2020-01-09 DIAGNOSIS — H35363 Drusen (degenerative) of macula, bilateral: Secondary | ICD-10-CM | POA: Diagnosis not present

## 2020-01-09 DIAGNOSIS — H5203 Hypermetropia, bilateral: Secondary | ICD-10-CM | POA: Diagnosis not present

## 2020-01-09 DIAGNOSIS — H524 Presbyopia: Secondary | ICD-10-CM | POA: Diagnosis not present

## 2020-01-09 DIAGNOSIS — H52223 Regular astigmatism, bilateral: Secondary | ICD-10-CM | POA: Diagnosis not present

## 2020-01-09 DIAGNOSIS — Z961 Presence of intraocular lens: Secondary | ICD-10-CM | POA: Diagnosis not present

## 2020-01-10 DIAGNOSIS — M533 Sacrococcygeal disorders, not elsewhere classified: Secondary | ICD-10-CM | POA: Diagnosis not present

## 2020-01-10 DIAGNOSIS — R454 Irritability and anger: Secondary | ICD-10-CM | POA: Diagnosis not present

## 2020-01-10 DIAGNOSIS — Z781 Physical restraint status: Secondary | ICD-10-CM | POA: Diagnosis not present

## 2020-01-10 DIAGNOSIS — R456 Violent behavior: Secondary | ICD-10-CM | POA: Diagnosis not present

## 2020-01-10 DIAGNOSIS — G301 Alzheimer's disease with late onset: Secondary | ICD-10-CM | POA: Diagnosis not present

## 2020-01-10 DIAGNOSIS — N3000 Acute cystitis without hematuria: Secondary | ICD-10-CM | POA: Diagnosis not present

## 2020-01-10 DIAGNOSIS — F0281 Dementia in other diseases classified elsewhere with behavioral disturbance: Secondary | ICD-10-CM | POA: Diagnosis not present

## 2020-01-10 DIAGNOSIS — R10813 Right lower quadrant abdominal tenderness: Secondary | ICD-10-CM | POA: Diagnosis not present

## 2020-01-10 DIAGNOSIS — R451 Restlessness and agitation: Secondary | ICD-10-CM | POA: Diagnosis not present

## 2020-01-10 DIAGNOSIS — Z79899 Other long term (current) drug therapy: Secondary | ICD-10-CM | POA: Diagnosis not present

## 2020-01-10 DIAGNOSIS — S300XXA Contusion of lower back and pelvis, initial encounter: Secondary | ICD-10-CM | POA: Diagnosis not present

## 2020-01-10 DIAGNOSIS — F911 Conduct disorder, childhood-onset type: Secondary | ICD-10-CM | POA: Diagnosis not present

## 2020-01-10 DIAGNOSIS — B958 Unspecified staphylococcus as the cause of diseases classified elsewhere: Secondary | ICD-10-CM | POA: Diagnosis not present

## 2020-01-10 DIAGNOSIS — F0391 Unspecified dementia with behavioral disturbance: Secondary | ICD-10-CM | POA: Diagnosis not present

## 2020-01-10 DIAGNOSIS — F919 Conduct disorder, unspecified: Secondary | ICD-10-CM | POA: Diagnosis not present

## 2020-01-10 DIAGNOSIS — E559 Vitamin D deficiency, unspecified: Secondary | ICD-10-CM | POA: Diagnosis not present

## 2020-01-10 DIAGNOSIS — D509 Iron deficiency anemia, unspecified: Secondary | ICD-10-CM | POA: Diagnosis not present

## 2020-01-10 DIAGNOSIS — R918 Other nonspecific abnormal finding of lung field: Secondary | ICD-10-CM | POA: Diagnosis not present

## 2020-01-10 DIAGNOSIS — F333 Major depressive disorder, recurrent, severe with psychotic symptoms: Secondary | ICD-10-CM | POA: Diagnosis not present

## 2020-01-10 DIAGNOSIS — W1839XA Other fall on same level, initial encounter: Secondary | ICD-10-CM | POA: Diagnosis not present

## 2020-01-10 DIAGNOSIS — R4182 Altered mental status, unspecified: Secondary | ICD-10-CM | POA: Diagnosis not present

## 2020-01-11 DIAGNOSIS — F333 Major depressive disorder, recurrent, severe with psychotic symptoms: Secondary | ICD-10-CM | POA: Diagnosis not present

## 2020-01-11 DIAGNOSIS — R918 Other nonspecific abnormal finding of lung field: Secondary | ICD-10-CM | POA: Diagnosis not present

## 2020-01-11 DIAGNOSIS — F0281 Dementia in other diseases classified elsewhere with behavioral disturbance: Secondary | ICD-10-CM | POA: Diagnosis not present

## 2020-01-11 DIAGNOSIS — G301 Alzheimer's disease with late onset: Secondary | ICD-10-CM | POA: Diagnosis not present

## 2020-01-11 DIAGNOSIS — N3000 Acute cystitis without hematuria: Secondary | ICD-10-CM | POA: Diagnosis not present

## 2020-01-11 DIAGNOSIS — B958 Unspecified staphylococcus as the cause of diseases classified elsewhere: Secondary | ICD-10-CM | POA: Diagnosis not present

## 2020-01-11 DIAGNOSIS — R4182 Altered mental status, unspecified: Secondary | ICD-10-CM | POA: Diagnosis not present

## 2020-01-11 DIAGNOSIS — R4689 Other symptoms and signs involving appearance and behavior: Secondary | ICD-10-CM | POA: Diagnosis not present

## 2020-01-11 DIAGNOSIS — R10813 Right lower quadrant abdominal tenderness: Secondary | ICD-10-CM | POA: Diagnosis not present

## 2020-01-12 DIAGNOSIS — N3000 Acute cystitis without hematuria: Secondary | ICD-10-CM | POA: Diagnosis not present

## 2020-01-12 DIAGNOSIS — D509 Iron deficiency anemia, unspecified: Secondary | ICD-10-CM | POA: Diagnosis not present

## 2020-01-12 DIAGNOSIS — R4689 Other symptoms and signs involving appearance and behavior: Secondary | ICD-10-CM | POA: Diagnosis not present

## 2020-01-13 DIAGNOSIS — G3189 Other specified degenerative diseases of nervous system: Secondary | ICD-10-CM | POA: Diagnosis not present

## 2020-01-13 DIAGNOSIS — R531 Weakness: Secondary | ICD-10-CM | POA: Diagnosis not present

## 2020-01-13 DIAGNOSIS — S300XXA Contusion of lower back and pelvis, initial encounter: Secondary | ICD-10-CM | POA: Diagnosis not present

## 2020-01-13 DIAGNOSIS — D509 Iron deficiency anemia, unspecified: Secondary | ICD-10-CM | POA: Diagnosis not present

## 2020-01-13 DIAGNOSIS — F17210 Nicotine dependence, cigarettes, uncomplicated: Secondary | ICD-10-CM | POA: Diagnosis present

## 2020-01-13 DIAGNOSIS — U071 COVID-19: Secondary | ICD-10-CM | POA: Diagnosis not present

## 2020-01-13 DIAGNOSIS — E538 Deficiency of other specified B group vitamins: Secondary | ICD-10-CM | POA: Diagnosis not present

## 2020-01-13 DIAGNOSIS — J189 Pneumonia, unspecified organism: Secondary | ICD-10-CM | POA: Diagnosis not present

## 2020-01-13 DIAGNOSIS — D7589 Other specified diseases of blood and blood-forming organs: Secondary | ICD-10-CM | POA: Diagnosis not present

## 2020-01-13 DIAGNOSIS — Z9884 Bariatric surgery status: Secondary | ICD-10-CM | POA: Diagnosis not present

## 2020-01-13 DIAGNOSIS — M1991 Primary osteoarthritis, unspecified site: Secondary | ICD-10-CM | POA: Diagnosis not present

## 2020-01-13 DIAGNOSIS — F0391 Unspecified dementia with behavioral disturbance: Secondary | ICD-10-CM | POA: Diagnosis not present

## 2020-01-13 DIAGNOSIS — Z9181 History of falling: Secondary | ICD-10-CM | POA: Diagnosis not present

## 2020-01-13 DIAGNOSIS — R8290 Unspecified abnormal findings in urine: Secondary | ICD-10-CM | POA: Diagnosis not present

## 2020-01-13 DIAGNOSIS — N3000 Acute cystitis without hematuria: Secondary | ICD-10-CM | POA: Diagnosis not present

## 2020-01-13 DIAGNOSIS — F0280 Dementia in other diseases classified elsewhere without behavioral disturbance: Secondary | ICD-10-CM | POA: Diagnosis not present

## 2020-01-13 DIAGNOSIS — B957 Other staphylococcus as the cause of diseases classified elsewhere: Secondary | ICD-10-CM | POA: Diagnosis present

## 2020-01-13 DIAGNOSIS — I959 Hypotension, unspecified: Secondary | ICD-10-CM | POA: Diagnosis not present

## 2020-01-13 DIAGNOSIS — R9389 Abnormal findings on diagnostic imaging of other specified body structures: Secondary | ICD-10-CM | POA: Diagnosis not present

## 2020-01-13 DIAGNOSIS — Z8616 Personal history of COVID-19: Secondary | ICD-10-CM | POA: Diagnosis not present

## 2020-01-13 DIAGNOSIS — Z79899 Other long term (current) drug therapy: Secondary | ICD-10-CM | POA: Diagnosis not present

## 2020-01-13 DIAGNOSIS — K5710 Diverticulosis of small intestine without perforation or abscess without bleeding: Secondary | ICD-10-CM | POA: Diagnosis present

## 2020-01-13 DIAGNOSIS — D508 Other iron deficiency anemias: Secondary | ICD-10-CM | POA: Diagnosis not present

## 2020-01-13 DIAGNOSIS — E559 Vitamin D deficiency, unspecified: Secondary | ICD-10-CM | POA: Diagnosis not present

## 2020-01-13 DIAGNOSIS — N390 Urinary tract infection, site not specified: Secondary | ICD-10-CM | POA: Diagnosis present

## 2020-01-13 DIAGNOSIS — Z9114 Patient's other noncompliance with medication regimen: Secondary | ICD-10-CM | POA: Diagnosis not present

## 2020-01-13 DIAGNOSIS — F329 Major depressive disorder, single episode, unspecified: Secondary | ICD-10-CM | POA: Diagnosis not present

## 2020-01-13 DIAGNOSIS — Z8701 Personal history of pneumonia (recurrent): Secondary | ICD-10-CM | POA: Diagnosis not present

## 2020-01-13 DIAGNOSIS — R456 Violent behavior: Secondary | ICD-10-CM | POA: Diagnosis not present

## 2020-01-13 DIAGNOSIS — I712 Thoracic aortic aneurysm, without rupture: Secondary | ICD-10-CM | POA: Diagnosis not present

## 2020-01-24 DIAGNOSIS — F329 Major depressive disorder, single episode, unspecified: Secondary | ICD-10-CM | POA: Diagnosis not present

## 2020-01-24 DIAGNOSIS — F0391 Unspecified dementia with behavioral disturbance: Secondary | ICD-10-CM | POA: Diagnosis not present

## 2020-01-24 DIAGNOSIS — E559 Vitamin D deficiency, unspecified: Secondary | ICD-10-CM | POA: Diagnosis not present

## 2020-02-07 DIAGNOSIS — F411 Generalized anxiety disorder: Secondary | ICD-10-CM | POA: Diagnosis not present

## 2020-02-07 DIAGNOSIS — F0391 Unspecified dementia with behavioral disturbance: Secondary | ICD-10-CM | POA: Diagnosis not present

## 2020-02-10 DIAGNOSIS — F0280 Dementia in other diseases classified elsewhere without behavioral disturbance: Secondary | ICD-10-CM | POA: Diagnosis not present

## 2020-02-10 DIAGNOSIS — U071 COVID-19: Secondary | ICD-10-CM | POA: Diagnosis not present

## 2020-02-10 DIAGNOSIS — G3189 Other specified degenerative diseases of nervous system: Secondary | ICD-10-CM | POA: Diagnosis not present

## 2020-02-10 DIAGNOSIS — I959 Hypotension, unspecified: Secondary | ICD-10-CM | POA: Diagnosis not present

## 2020-02-10 DIAGNOSIS — R531 Weakness: Secondary | ICD-10-CM | POA: Diagnosis not present

## 2020-02-10 DIAGNOSIS — D509 Iron deficiency anemia, unspecified: Secondary | ICD-10-CM | POA: Diagnosis not present

## 2020-02-11 DIAGNOSIS — D509 Iron deficiency anemia, unspecified: Secondary | ICD-10-CM | POA: Diagnosis not present

## 2020-02-11 DIAGNOSIS — I959 Hypotension, unspecified: Secondary | ICD-10-CM | POA: Diagnosis not present

## 2020-02-11 DIAGNOSIS — R531 Weakness: Secondary | ICD-10-CM | POA: Diagnosis not present

## 2020-02-11 DIAGNOSIS — F0280 Dementia in other diseases classified elsewhere without behavioral disturbance: Secondary | ICD-10-CM | POA: Diagnosis not present

## 2020-02-11 DIAGNOSIS — U071 COVID-19: Secondary | ICD-10-CM | POA: Diagnosis not present

## 2020-02-11 DIAGNOSIS — G3189 Other specified degenerative diseases of nervous system: Secondary | ICD-10-CM | POA: Diagnosis not present

## 2020-02-14 DIAGNOSIS — R531 Weakness: Secondary | ICD-10-CM | POA: Diagnosis not present

## 2020-02-14 DIAGNOSIS — G3189 Other specified degenerative diseases of nervous system: Secondary | ICD-10-CM | POA: Diagnosis not present

## 2020-02-14 DIAGNOSIS — F0280 Dementia in other diseases classified elsewhere without behavioral disturbance: Secondary | ICD-10-CM | POA: Diagnosis not present

## 2020-02-14 DIAGNOSIS — D509 Iron deficiency anemia, unspecified: Secondary | ICD-10-CM | POA: Diagnosis not present

## 2020-02-14 DIAGNOSIS — U071 COVID-19: Secondary | ICD-10-CM | POA: Diagnosis not present

## 2020-02-14 DIAGNOSIS — I959 Hypotension, unspecified: Secondary | ICD-10-CM | POA: Diagnosis not present

## 2020-02-15 DIAGNOSIS — I959 Hypotension, unspecified: Secondary | ICD-10-CM | POA: Diagnosis not present

## 2020-02-15 DIAGNOSIS — U071 COVID-19: Secondary | ICD-10-CM | POA: Diagnosis not present

## 2020-02-15 DIAGNOSIS — D509 Iron deficiency anemia, unspecified: Secondary | ICD-10-CM | POA: Diagnosis not present

## 2020-02-15 DIAGNOSIS — F0280 Dementia in other diseases classified elsewhere without behavioral disturbance: Secondary | ICD-10-CM | POA: Diagnosis not present

## 2020-02-15 DIAGNOSIS — G3189 Other specified degenerative diseases of nervous system: Secondary | ICD-10-CM | POA: Diagnosis not present

## 2020-02-15 DIAGNOSIS — R531 Weakness: Secondary | ICD-10-CM | POA: Diagnosis not present

## 2020-02-19 DIAGNOSIS — F0280 Dementia in other diseases classified elsewhere without behavioral disturbance: Secondary | ICD-10-CM | POA: Diagnosis not present

## 2020-02-19 DIAGNOSIS — G3189 Other specified degenerative diseases of nervous system: Secondary | ICD-10-CM | POA: Diagnosis not present

## 2020-02-19 DIAGNOSIS — D509 Iron deficiency anemia, unspecified: Secondary | ICD-10-CM | POA: Diagnosis not present

## 2020-02-19 DIAGNOSIS — U071 COVID-19: Secondary | ICD-10-CM | POA: Diagnosis not present

## 2020-02-19 DIAGNOSIS — I959 Hypotension, unspecified: Secondary | ICD-10-CM | POA: Diagnosis not present

## 2020-02-19 DIAGNOSIS — R531 Weakness: Secondary | ICD-10-CM | POA: Diagnosis not present

## 2020-02-20 DIAGNOSIS — F0280 Dementia in other diseases classified elsewhere without behavioral disturbance: Secondary | ICD-10-CM | POA: Diagnosis not present

## 2020-02-20 DIAGNOSIS — G3189 Other specified degenerative diseases of nervous system: Secondary | ICD-10-CM | POA: Diagnosis not present

## 2020-02-20 DIAGNOSIS — R531 Weakness: Secondary | ICD-10-CM | POA: Diagnosis not present

## 2020-02-20 DIAGNOSIS — D509 Iron deficiency anemia, unspecified: Secondary | ICD-10-CM | POA: Diagnosis not present

## 2020-02-20 DIAGNOSIS — U071 COVID-19: Secondary | ICD-10-CM | POA: Diagnosis not present

## 2020-02-20 DIAGNOSIS — I959 Hypotension, unspecified: Secondary | ICD-10-CM | POA: Diagnosis not present

## 2020-02-21 DIAGNOSIS — E559 Vitamin D deficiency, unspecified: Secondary | ICD-10-CM | POA: Diagnosis not present

## 2020-02-21 DIAGNOSIS — E538 Deficiency of other specified B group vitamins: Secondary | ICD-10-CM | POA: Diagnosis not present

## 2020-02-21 DIAGNOSIS — F0390 Unspecified dementia without behavioral disturbance: Secondary | ICD-10-CM | POA: Diagnosis not present

## 2020-02-21 DIAGNOSIS — F329 Major depressive disorder, single episode, unspecified: Secondary | ICD-10-CM | POA: Diagnosis not present

## 2020-02-21 DIAGNOSIS — F0391 Unspecified dementia with behavioral disturbance: Secondary | ICD-10-CM | POA: Diagnosis not present

## 2020-03-22 DIAGNOSIS — E538 Deficiency of other specified B group vitamins: Secondary | ICD-10-CM | POA: Diagnosis not present

## 2020-03-24 DIAGNOSIS — E559 Vitamin D deficiency, unspecified: Secondary | ICD-10-CM | POA: Diagnosis not present

## 2020-03-24 DIAGNOSIS — E119 Type 2 diabetes mellitus without complications: Secondary | ICD-10-CM | POA: Diagnosis not present

## 2020-03-24 DIAGNOSIS — Z79899 Other long term (current) drug therapy: Secondary | ICD-10-CM | POA: Diagnosis not present

## 2020-03-24 DIAGNOSIS — E7849 Other hyperlipidemia: Secondary | ICD-10-CM | POA: Diagnosis not present

## 2020-03-24 DIAGNOSIS — D518 Other vitamin B12 deficiency anemias: Secondary | ICD-10-CM | POA: Diagnosis not present

## 2020-03-24 DIAGNOSIS — E038 Other specified hypothyroidism: Secondary | ICD-10-CM | POA: Diagnosis not present

## 2020-03-25 DIAGNOSIS — F0390 Unspecified dementia without behavioral disturbance: Secondary | ICD-10-CM | POA: Diagnosis not present

## 2020-03-25 DIAGNOSIS — F0391 Unspecified dementia with behavioral disturbance: Secondary | ICD-10-CM | POA: Diagnosis not present

## 2020-03-25 DIAGNOSIS — F411 Generalized anxiety disorder: Secondary | ICD-10-CM | POA: Diagnosis not present

## 2020-03-25 DIAGNOSIS — F329 Major depressive disorder, single episode, unspecified: Secondary | ICD-10-CM | POA: Diagnosis not present

## 2020-04-21 DIAGNOSIS — E538 Deficiency of other specified B group vitamins: Secondary | ICD-10-CM | POA: Diagnosis not present

## 2020-04-22 DIAGNOSIS — F0390 Unspecified dementia without behavioral disturbance: Secondary | ICD-10-CM | POA: Diagnosis not present

## 2020-04-22 DIAGNOSIS — F329 Major depressive disorder, single episode, unspecified: Secondary | ICD-10-CM | POA: Diagnosis not present

## 2020-04-22 DIAGNOSIS — F411 Generalized anxiety disorder: Secondary | ICD-10-CM | POA: Diagnosis not present

## 2020-04-22 DIAGNOSIS — F0391 Unspecified dementia with behavioral disturbance: Secondary | ICD-10-CM | POA: Diagnosis not present

## 2020-04-23 DIAGNOSIS — F0391 Unspecified dementia with behavioral disturbance: Secondary | ICD-10-CM | POA: Diagnosis not present

## 2020-04-23 DIAGNOSIS — F411 Generalized anxiety disorder: Secondary | ICD-10-CM | POA: Diagnosis not present

## 2020-04-23 DIAGNOSIS — F324 Major depressive disorder, single episode, in partial remission: Secondary | ICD-10-CM | POA: Diagnosis not present

## 2020-05-12 DIAGNOSIS — Z743 Need for continuous supervision: Secondary | ICD-10-CM | POA: Diagnosis not present

## 2020-05-12 DIAGNOSIS — M25511 Pain in right shoulder: Secondary | ICD-10-CM | POA: Diagnosis not present

## 2020-05-12 DIAGNOSIS — R279 Unspecified lack of coordination: Secondary | ICD-10-CM | POA: Diagnosis not present

## 2020-05-12 DIAGNOSIS — I959 Hypotension, unspecified: Secondary | ICD-10-CM | POA: Diagnosis not present

## 2020-05-12 DIAGNOSIS — R001 Bradycardia, unspecified: Secondary | ICD-10-CM | POA: Diagnosis not present

## 2020-05-12 DIAGNOSIS — R404 Transient alteration of awareness: Secondary | ICD-10-CM | POA: Diagnosis not present

## 2020-05-21 DIAGNOSIS — Z9181 History of falling: Secondary | ICD-10-CM | POA: Diagnosis not present

## 2020-05-21 DIAGNOSIS — I712 Thoracic aortic aneurysm, without rupture: Secondary | ICD-10-CM | POA: Diagnosis not present

## 2020-05-21 DIAGNOSIS — I959 Hypotension, unspecified: Secondary | ICD-10-CM | POA: Diagnosis not present

## 2020-05-21 DIAGNOSIS — D509 Iron deficiency anemia, unspecified: Secondary | ICD-10-CM | POA: Diagnosis not present

## 2020-05-21 DIAGNOSIS — G3189 Other specified degenerative diseases of nervous system: Secondary | ICD-10-CM | POA: Diagnosis not present

## 2020-05-21 DIAGNOSIS — M1991 Primary osteoarthritis, unspecified site: Secondary | ICD-10-CM | POA: Diagnosis not present

## 2020-05-21 DIAGNOSIS — Z87440 Personal history of urinary (tract) infections: Secondary | ICD-10-CM | POA: Diagnosis not present

## 2020-05-21 DIAGNOSIS — F0281 Dementia in other diseases classified elsewhere with behavioral disturbance: Secondary | ICD-10-CM | POA: Diagnosis not present

## 2020-05-21 DIAGNOSIS — Z8701 Personal history of pneumonia (recurrent): Secondary | ICD-10-CM | POA: Diagnosis not present

## 2020-05-21 DIAGNOSIS — E559 Vitamin D deficiency, unspecified: Secondary | ICD-10-CM | POA: Diagnosis not present

## 2020-05-21 DIAGNOSIS — F329 Major depressive disorder, single episode, unspecified: Secondary | ICD-10-CM | POA: Diagnosis not present

## 2020-05-21 DIAGNOSIS — E538 Deficiency of other specified B group vitamins: Secondary | ICD-10-CM | POA: Diagnosis not present

## 2020-05-22 DIAGNOSIS — F0281 Dementia in other diseases classified elsewhere with behavioral disturbance: Secondary | ICD-10-CM | POA: Diagnosis not present

## 2020-05-22 DIAGNOSIS — G3189 Other specified degenerative diseases of nervous system: Secondary | ICD-10-CM | POA: Diagnosis not present

## 2020-05-22 DIAGNOSIS — D509 Iron deficiency anemia, unspecified: Secondary | ICD-10-CM | POA: Diagnosis not present

## 2020-05-22 DIAGNOSIS — E538 Deficiency of other specified B group vitamins: Secondary | ICD-10-CM | POA: Diagnosis not present

## 2020-05-22 DIAGNOSIS — I959 Hypotension, unspecified: Secondary | ICD-10-CM | POA: Diagnosis not present

## 2020-05-22 DIAGNOSIS — I712 Thoracic aortic aneurysm, without rupture: Secondary | ICD-10-CM | POA: Diagnosis not present

## 2020-06-04 DIAGNOSIS — F411 Generalized anxiety disorder: Secondary | ICD-10-CM | POA: Diagnosis not present

## 2020-06-04 DIAGNOSIS — F324 Major depressive disorder, single episode, in partial remission: Secondary | ICD-10-CM | POA: Diagnosis not present

## 2020-06-04 DIAGNOSIS — F0391 Unspecified dementia with behavioral disturbance: Secondary | ICD-10-CM | POA: Diagnosis not present

## 2020-06-16 DIAGNOSIS — G3189 Other specified degenerative diseases of nervous system: Secondary | ICD-10-CM | POA: Diagnosis not present

## 2020-06-16 DIAGNOSIS — E7849 Other hyperlipidemia: Secondary | ICD-10-CM | POA: Diagnosis not present

## 2020-06-16 DIAGNOSIS — D509 Iron deficiency anemia, unspecified: Secondary | ICD-10-CM | POA: Diagnosis not present

## 2020-06-16 DIAGNOSIS — F0281 Dementia in other diseases classified elsewhere with behavioral disturbance: Secondary | ICD-10-CM | POA: Diagnosis not present

## 2020-06-16 DIAGNOSIS — Z79899 Other long term (current) drug therapy: Secondary | ICD-10-CM | POA: Diagnosis not present

## 2020-06-16 DIAGNOSIS — D518 Other vitamin B12 deficiency anemias: Secondary | ICD-10-CM | POA: Diagnosis not present

## 2020-06-16 DIAGNOSIS — E559 Vitamin D deficiency, unspecified: Secondary | ICD-10-CM | POA: Diagnosis not present

## 2020-06-16 DIAGNOSIS — I959 Hypotension, unspecified: Secondary | ICD-10-CM | POA: Diagnosis not present

## 2020-06-16 DIAGNOSIS — E119 Type 2 diabetes mellitus without complications: Secondary | ICD-10-CM | POA: Diagnosis not present

## 2020-06-16 DIAGNOSIS — I712 Thoracic aortic aneurysm, without rupture: Secondary | ICD-10-CM | POA: Diagnosis not present

## 2020-06-16 DIAGNOSIS — E538 Deficiency of other specified B group vitamins: Secondary | ICD-10-CM | POA: Diagnosis not present

## 2020-06-16 DIAGNOSIS — E038 Other specified hypothyroidism: Secondary | ICD-10-CM | POA: Diagnosis not present

## 2020-06-17 DIAGNOSIS — F0390 Unspecified dementia without behavioral disturbance: Secondary | ICD-10-CM | POA: Diagnosis not present

## 2020-06-17 DIAGNOSIS — D508 Other iron deficiency anemias: Secondary | ICD-10-CM | POA: Diagnosis not present

## 2020-06-17 DIAGNOSIS — F411 Generalized anxiety disorder: Secondary | ICD-10-CM | POA: Diagnosis not present

## 2020-06-17 DIAGNOSIS — F329 Major depressive disorder, single episode, unspecified: Secondary | ICD-10-CM | POA: Diagnosis not present

## 2020-06-20 DIAGNOSIS — E559 Vitamin D deficiency, unspecified: Secondary | ICD-10-CM | POA: Diagnosis not present

## 2020-06-20 DIAGNOSIS — E538 Deficiency of other specified B group vitamins: Secondary | ICD-10-CM | POA: Diagnosis not present

## 2020-06-20 DIAGNOSIS — F0281 Dementia in other diseases classified elsewhere with behavioral disturbance: Secondary | ICD-10-CM | POA: Diagnosis not present

## 2020-06-20 DIAGNOSIS — F329 Major depressive disorder, single episode, unspecified: Secondary | ICD-10-CM | POA: Diagnosis not present

## 2020-06-20 DIAGNOSIS — Z8701 Personal history of pneumonia (recurrent): Secondary | ICD-10-CM | POA: Diagnosis not present

## 2020-06-20 DIAGNOSIS — D509 Iron deficiency anemia, unspecified: Secondary | ICD-10-CM | POA: Diagnosis not present

## 2020-06-20 DIAGNOSIS — I959 Hypotension, unspecified: Secondary | ICD-10-CM | POA: Diagnosis not present

## 2020-06-20 DIAGNOSIS — Z9181 History of falling: Secondary | ICD-10-CM | POA: Diagnosis not present

## 2020-06-20 DIAGNOSIS — Z87440 Personal history of urinary (tract) infections: Secondary | ICD-10-CM | POA: Diagnosis not present

## 2020-06-20 DIAGNOSIS — I712 Thoracic aortic aneurysm, without rupture: Secondary | ICD-10-CM | POA: Diagnosis not present

## 2020-06-20 DIAGNOSIS — G3189 Other specified degenerative diseases of nervous system: Secondary | ICD-10-CM | POA: Diagnosis not present

## 2020-06-20 DIAGNOSIS — M1991 Primary osteoarthritis, unspecified site: Secondary | ICD-10-CM | POA: Diagnosis not present

## 2020-07-01 DIAGNOSIS — R2681 Unsteadiness on feet: Secondary | ICD-10-CM | POA: Diagnosis not present

## 2020-07-08 DIAGNOSIS — F0390 Unspecified dementia without behavioral disturbance: Secondary | ICD-10-CM | POA: Diagnosis not present

## 2020-07-08 DIAGNOSIS — F329 Major depressive disorder, single episode, unspecified: Secondary | ICD-10-CM | POA: Diagnosis not present

## 2020-07-08 DIAGNOSIS — F411 Generalized anxiety disorder: Secondary | ICD-10-CM | POA: Diagnosis not present

## 2020-07-11 DIAGNOSIS — F0281 Dementia in other diseases classified elsewhere with behavioral disturbance: Secondary | ICD-10-CM | POA: Diagnosis not present

## 2020-07-11 DIAGNOSIS — E538 Deficiency of other specified B group vitamins: Secondary | ICD-10-CM | POA: Diagnosis not present

## 2020-07-11 DIAGNOSIS — I959 Hypotension, unspecified: Secondary | ICD-10-CM | POA: Diagnosis not present

## 2020-07-11 DIAGNOSIS — M1991 Primary osteoarthritis, unspecified site: Secondary | ICD-10-CM | POA: Diagnosis not present

## 2020-07-11 DIAGNOSIS — I712 Thoracic aortic aneurysm, without rupture: Secondary | ICD-10-CM | POA: Diagnosis not present

## 2020-07-11 DIAGNOSIS — G3189 Other specified degenerative diseases of nervous system: Secondary | ICD-10-CM | POA: Diagnosis not present

## 2020-07-17 DIAGNOSIS — D508 Other iron deficiency anemias: Secondary | ICD-10-CM | POA: Diagnosis not present

## 2020-07-17 DIAGNOSIS — F0390 Unspecified dementia without behavioral disturbance: Secondary | ICD-10-CM | POA: Diagnosis not present

## 2020-07-17 DIAGNOSIS — F329 Major depressive disorder, single episode, unspecified: Secondary | ICD-10-CM | POA: Diagnosis not present

## 2020-07-17 DIAGNOSIS — F0391 Unspecified dementia with behavioral disturbance: Secondary | ICD-10-CM | POA: Diagnosis not present

## 2020-07-20 DIAGNOSIS — Z8701 Personal history of pneumonia (recurrent): Secondary | ICD-10-CM | POA: Diagnosis not present

## 2020-07-20 DIAGNOSIS — I712 Thoracic aortic aneurysm, without rupture: Secondary | ICD-10-CM | POA: Diagnosis not present

## 2020-07-20 DIAGNOSIS — D509 Iron deficiency anemia, unspecified: Secondary | ICD-10-CM | POA: Diagnosis not present

## 2020-07-20 DIAGNOSIS — M1991 Primary osteoarthritis, unspecified site: Secondary | ICD-10-CM | POA: Diagnosis not present

## 2020-07-20 DIAGNOSIS — G3189 Other specified degenerative diseases of nervous system: Secondary | ICD-10-CM | POA: Diagnosis not present

## 2020-07-20 DIAGNOSIS — E538 Deficiency of other specified B group vitamins: Secondary | ICD-10-CM | POA: Diagnosis not present

## 2020-07-20 DIAGNOSIS — Z9181 History of falling: Secondary | ICD-10-CM | POA: Diagnosis not present

## 2020-07-20 DIAGNOSIS — Z87440 Personal history of urinary (tract) infections: Secondary | ICD-10-CM | POA: Diagnosis not present

## 2020-07-20 DIAGNOSIS — E559 Vitamin D deficiency, unspecified: Secondary | ICD-10-CM | POA: Diagnosis not present

## 2020-07-20 DIAGNOSIS — F0281 Dementia in other diseases classified elsewhere with behavioral disturbance: Secondary | ICD-10-CM | POA: Diagnosis not present

## 2020-07-20 DIAGNOSIS — I959 Hypotension, unspecified: Secondary | ICD-10-CM | POA: Diagnosis not present

## 2020-07-20 DIAGNOSIS — F329 Major depressive disorder, single episode, unspecified: Secondary | ICD-10-CM | POA: Diagnosis not present

## 2020-07-23 DIAGNOSIS — E538 Deficiency of other specified B group vitamins: Secondary | ICD-10-CM | POA: Diagnosis not present

## 2020-07-23 DIAGNOSIS — I959 Hypotension, unspecified: Secondary | ICD-10-CM | POA: Diagnosis not present

## 2020-07-23 DIAGNOSIS — D509 Iron deficiency anemia, unspecified: Secondary | ICD-10-CM | POA: Diagnosis not present

## 2020-07-23 DIAGNOSIS — G3189 Other specified degenerative diseases of nervous system: Secondary | ICD-10-CM | POA: Diagnosis not present

## 2020-07-23 DIAGNOSIS — I712 Thoracic aortic aneurysm, without rupture: Secondary | ICD-10-CM | POA: Diagnosis not present

## 2020-07-23 DIAGNOSIS — F0281 Dementia in other diseases classified elsewhere with behavioral disturbance: Secondary | ICD-10-CM | POA: Diagnosis not present

## 2020-08-11 DIAGNOSIS — R229 Localized swelling, mass and lump, unspecified: Secondary | ICD-10-CM | POA: Diagnosis not present

## 2020-08-11 DIAGNOSIS — I6523 Occlusion and stenosis of bilateral carotid arteries: Secondary | ICD-10-CM | POA: Diagnosis not present

## 2020-08-11 DIAGNOSIS — D492 Neoplasm of unspecified behavior of bone, soft tissue, and skin: Secondary | ICD-10-CM | POA: Diagnosis not present

## 2020-08-11 DIAGNOSIS — R0989 Other specified symptoms and signs involving the circulatory and respiratory systems: Secondary | ICD-10-CM | POA: Diagnosis not present

## 2020-08-11 DIAGNOSIS — R590 Localized enlarged lymph nodes: Secondary | ICD-10-CM | POA: Diagnosis not present

## 2020-08-12 DIAGNOSIS — F411 Generalized anxiety disorder: Secondary | ICD-10-CM | POA: Diagnosis not present

## 2020-08-12 DIAGNOSIS — F331 Major depressive disorder, recurrent, moderate: Secondary | ICD-10-CM | POA: Diagnosis not present

## 2020-08-12 DIAGNOSIS — F0391 Unspecified dementia with behavioral disturbance: Secondary | ICD-10-CM | POA: Diagnosis not present

## 2020-08-13 DIAGNOSIS — R011 Cardiac murmur, unspecified: Secondary | ICD-10-CM | POA: Diagnosis not present

## 2020-08-15 DIAGNOSIS — E538 Deficiency of other specified B group vitamins: Secondary | ICD-10-CM | POA: Diagnosis not present

## 2020-08-15 DIAGNOSIS — E042 Nontoxic multinodular goiter: Secondary | ICD-10-CM | POA: Diagnosis not present

## 2020-08-15 DIAGNOSIS — I714 Abdominal aortic aneurysm, without rupture: Secondary | ICD-10-CM | POA: Diagnosis not present

## 2020-08-15 DIAGNOSIS — G3189 Other specified degenerative diseases of nervous system: Secondary | ICD-10-CM | POA: Diagnosis not present

## 2020-08-15 DIAGNOSIS — D509 Iron deficiency anemia, unspecified: Secondary | ICD-10-CM | POA: Diagnosis not present

## 2020-08-15 DIAGNOSIS — R221 Localized swelling, mass and lump, neck: Secondary | ICD-10-CM | POA: Diagnosis not present

## 2020-08-15 DIAGNOSIS — I700 Atherosclerosis of aorta: Secondary | ICD-10-CM | POA: Diagnosis not present

## 2020-08-15 DIAGNOSIS — F0281 Dementia in other diseases classified elsewhere with behavioral disturbance: Secondary | ICD-10-CM | POA: Diagnosis not present

## 2020-08-15 DIAGNOSIS — I959 Hypotension, unspecified: Secondary | ICD-10-CM | POA: Diagnosis not present

## 2020-08-15 DIAGNOSIS — I712 Thoracic aortic aneurysm, without rupture: Secondary | ICD-10-CM | POA: Diagnosis not present

## 2020-08-18 DIAGNOSIS — Z79899 Other long term (current) drug therapy: Secondary | ICD-10-CM | POA: Diagnosis not present

## 2020-08-18 DIAGNOSIS — E7849 Other hyperlipidemia: Secondary | ICD-10-CM | POA: Diagnosis not present

## 2020-08-18 DIAGNOSIS — E559 Vitamin D deficiency, unspecified: Secondary | ICD-10-CM | POA: Diagnosis not present

## 2020-08-18 DIAGNOSIS — I70219 Atherosclerosis of native arteries of extremities with intermittent claudication, unspecified extremity: Secondary | ICD-10-CM | POA: Diagnosis not present

## 2020-08-18 DIAGNOSIS — E119 Type 2 diabetes mellitus without complications: Secondary | ICD-10-CM | POA: Diagnosis not present

## 2020-08-18 DIAGNOSIS — I70229 Atherosclerosis of native arteries of extremities with rest pain, unspecified extremity: Secondary | ICD-10-CM | POA: Diagnosis not present

## 2020-08-18 DIAGNOSIS — D518 Other vitamin B12 deficiency anemias: Secondary | ICD-10-CM | POA: Diagnosis not present

## 2020-08-18 DIAGNOSIS — I739 Peripheral vascular disease, unspecified: Secondary | ICD-10-CM | POA: Diagnosis not present

## 2020-08-18 DIAGNOSIS — E038 Other specified hypothyroidism: Secondary | ICD-10-CM | POA: Diagnosis not present

## 2020-08-19 DIAGNOSIS — G3189 Other specified degenerative diseases of nervous system: Secondary | ICD-10-CM | POA: Diagnosis not present

## 2020-08-19 DIAGNOSIS — M1991 Primary osteoarthritis, unspecified site: Secondary | ICD-10-CM | POA: Diagnosis not present

## 2020-08-19 DIAGNOSIS — E538 Deficiency of other specified B group vitamins: Secondary | ICD-10-CM | POA: Diagnosis not present

## 2020-08-19 DIAGNOSIS — F0281 Dementia in other diseases classified elsewhere with behavioral disturbance: Secondary | ICD-10-CM | POA: Diagnosis not present

## 2020-08-19 DIAGNOSIS — D508 Other iron deficiency anemias: Secondary | ICD-10-CM | POA: Diagnosis not present

## 2020-08-19 DIAGNOSIS — F411 Generalized anxiety disorder: Secondary | ICD-10-CM | POA: Diagnosis not present

## 2020-08-19 DIAGNOSIS — I959 Hypotension, unspecified: Secondary | ICD-10-CM | POA: Diagnosis not present

## 2020-08-19 DIAGNOSIS — D509 Iron deficiency anemia, unspecified: Secondary | ICD-10-CM | POA: Diagnosis not present

## 2020-08-19 DIAGNOSIS — Z87440 Personal history of urinary (tract) infections: Secondary | ICD-10-CM | POA: Diagnosis not present

## 2020-08-19 DIAGNOSIS — Z9181 History of falling: Secondary | ICD-10-CM | POA: Diagnosis not present

## 2020-08-19 DIAGNOSIS — F0391 Unspecified dementia with behavioral disturbance: Secondary | ICD-10-CM | POA: Diagnosis not present

## 2020-08-19 DIAGNOSIS — F0390 Unspecified dementia without behavioral disturbance: Secondary | ICD-10-CM | POA: Diagnosis not present

## 2020-08-19 DIAGNOSIS — Z8701 Personal history of pneumonia (recurrent): Secondary | ICD-10-CM | POA: Diagnosis not present

## 2020-08-19 DIAGNOSIS — F329 Major depressive disorder, single episode, unspecified: Secondary | ICD-10-CM | POA: Diagnosis not present

## 2020-08-19 DIAGNOSIS — E559 Vitamin D deficiency, unspecified: Secondary | ICD-10-CM | POA: Diagnosis not present

## 2020-08-19 DIAGNOSIS — I712 Thoracic aortic aneurysm, without rupture: Secondary | ICD-10-CM | POA: Diagnosis not present

## 2020-08-21 DIAGNOSIS — D509 Iron deficiency anemia, unspecified: Secondary | ICD-10-CM | POA: Diagnosis not present

## 2020-08-21 DIAGNOSIS — E538 Deficiency of other specified B group vitamins: Secondary | ICD-10-CM | POA: Diagnosis not present

## 2020-08-21 DIAGNOSIS — I959 Hypotension, unspecified: Secondary | ICD-10-CM | POA: Diagnosis not present

## 2020-08-21 DIAGNOSIS — F0281 Dementia in other diseases classified elsewhere with behavioral disturbance: Secondary | ICD-10-CM | POA: Diagnosis not present

## 2020-08-21 DIAGNOSIS — G3189 Other specified degenerative diseases of nervous system: Secondary | ICD-10-CM | POA: Diagnosis not present

## 2020-08-21 DIAGNOSIS — I712 Thoracic aortic aneurysm, without rupture: Secondary | ICD-10-CM | POA: Diagnosis not present

## 2020-09-09 DIAGNOSIS — F0391 Unspecified dementia with behavioral disturbance: Secondary | ICD-10-CM | POA: Diagnosis not present

## 2020-09-09 DIAGNOSIS — F331 Major depressive disorder, recurrent, moderate: Secondary | ICD-10-CM | POA: Diagnosis not present

## 2020-09-09 DIAGNOSIS — F411 Generalized anxiety disorder: Secondary | ICD-10-CM | POA: Diagnosis not present

## 2020-09-13 DIAGNOSIS — F17210 Nicotine dependence, cigarettes, uncomplicated: Secondary | ICD-10-CM | POA: Diagnosis not present

## 2020-09-13 DIAGNOSIS — N3090 Cystitis, unspecified without hematuria: Secondary | ICD-10-CM | POA: Diagnosis not present

## 2020-09-13 DIAGNOSIS — M2550 Pain in unspecified joint: Secondary | ICD-10-CM | POA: Diagnosis not present

## 2020-09-13 DIAGNOSIS — R9431 Abnormal electrocardiogram [ECG] [EKG]: Secondary | ICD-10-CM | POA: Diagnosis not present

## 2020-09-13 DIAGNOSIS — Z7401 Bed confinement status: Secondary | ICD-10-CM | POA: Diagnosis not present

## 2020-09-13 DIAGNOSIS — F0390 Unspecified dementia without behavioral disturbance: Secondary | ICD-10-CM | POA: Diagnosis not present

## 2020-09-13 DIAGNOSIS — E119 Type 2 diabetes mellitus without complications: Secondary | ICD-10-CM | POA: Diagnosis not present

## 2020-09-13 DIAGNOSIS — N179 Acute kidney failure, unspecified: Secondary | ICD-10-CM | POA: Diagnosis not present

## 2020-09-13 DIAGNOSIS — Z79899 Other long term (current) drug therapy: Secondary | ICD-10-CM | POA: Diagnosis not present

## 2020-09-13 DIAGNOSIS — R5381 Other malaise: Secondary | ICD-10-CM | POA: Diagnosis not present

## 2020-09-16 DIAGNOSIS — F0391 Unspecified dementia with behavioral disturbance: Secondary | ICD-10-CM | POA: Diagnosis not present

## 2020-09-16 DIAGNOSIS — D508 Other iron deficiency anemias: Secondary | ICD-10-CM | POA: Diagnosis not present

## 2020-09-16 DIAGNOSIS — F411 Generalized anxiety disorder: Secondary | ICD-10-CM | POA: Diagnosis not present

## 2020-09-16 DIAGNOSIS — N390 Urinary tract infection, site not specified: Secondary | ICD-10-CM | POA: Diagnosis not present

## 2020-09-16 DIAGNOSIS — F0390 Unspecified dementia without behavioral disturbance: Secondary | ICD-10-CM | POA: Diagnosis not present

## 2020-09-18 DIAGNOSIS — E538 Deficiency of other specified B group vitamins: Secondary | ICD-10-CM | POA: Diagnosis not present

## 2020-09-18 DIAGNOSIS — Z9181 History of falling: Secondary | ICD-10-CM | POA: Diagnosis not present

## 2020-09-18 DIAGNOSIS — F0281 Dementia in other diseases classified elsewhere with behavioral disturbance: Secondary | ICD-10-CM | POA: Diagnosis not present

## 2020-09-18 DIAGNOSIS — Z8701 Personal history of pneumonia (recurrent): Secondary | ICD-10-CM | POA: Diagnosis not present

## 2020-09-18 DIAGNOSIS — M1991 Primary osteoarthritis, unspecified site: Secondary | ICD-10-CM | POA: Diagnosis not present

## 2020-09-18 DIAGNOSIS — G3189 Other specified degenerative diseases of nervous system: Secondary | ICD-10-CM | POA: Diagnosis not present

## 2020-09-18 DIAGNOSIS — Z87440 Personal history of urinary (tract) infections: Secondary | ICD-10-CM | POA: Diagnosis not present

## 2020-09-18 DIAGNOSIS — I712 Thoracic aortic aneurysm, without rupture: Secondary | ICD-10-CM | POA: Diagnosis not present

## 2020-09-18 DIAGNOSIS — E559 Vitamin D deficiency, unspecified: Secondary | ICD-10-CM | POA: Diagnosis not present

## 2020-09-18 DIAGNOSIS — I959 Hypotension, unspecified: Secondary | ICD-10-CM | POA: Diagnosis not present

## 2020-09-18 DIAGNOSIS — D509 Iron deficiency anemia, unspecified: Secondary | ICD-10-CM | POA: Diagnosis not present

## 2020-09-18 DIAGNOSIS — F329 Major depressive disorder, single episode, unspecified: Secondary | ICD-10-CM | POA: Diagnosis not present

## 2020-09-23 DIAGNOSIS — E538 Deficiency of other specified B group vitamins: Secondary | ICD-10-CM | POA: Diagnosis not present

## 2020-09-23 DIAGNOSIS — F0281 Dementia in other diseases classified elsewhere with behavioral disturbance: Secondary | ICD-10-CM | POA: Diagnosis not present

## 2020-09-23 DIAGNOSIS — G3189 Other specified degenerative diseases of nervous system: Secondary | ICD-10-CM | POA: Diagnosis not present

## 2020-09-23 DIAGNOSIS — D509 Iron deficiency anemia, unspecified: Secondary | ICD-10-CM | POA: Diagnosis not present

## 2020-09-23 DIAGNOSIS — I712 Thoracic aortic aneurysm, without rupture: Secondary | ICD-10-CM | POA: Diagnosis not present

## 2020-09-23 DIAGNOSIS — I959 Hypotension, unspecified: Secondary | ICD-10-CM | POA: Diagnosis not present

## 2020-09-25 DIAGNOSIS — Z23 Encounter for immunization: Secondary | ICD-10-CM | POA: Diagnosis not present

## 2020-10-02 DIAGNOSIS — R350 Frequency of micturition: Secondary | ICD-10-CM | POA: Diagnosis not present

## 2020-10-06 DIAGNOSIS — E538 Deficiency of other specified B group vitamins: Secondary | ICD-10-CM | POA: Diagnosis not present

## 2020-10-06 DIAGNOSIS — I712 Thoracic aortic aneurysm, without rupture: Secondary | ICD-10-CM | POA: Diagnosis not present

## 2020-10-06 DIAGNOSIS — G3189 Other specified degenerative diseases of nervous system: Secondary | ICD-10-CM | POA: Diagnosis not present

## 2020-10-06 DIAGNOSIS — F0281 Dementia in other diseases classified elsewhere with behavioral disturbance: Secondary | ICD-10-CM | POA: Diagnosis not present

## 2020-10-06 DIAGNOSIS — I959 Hypotension, unspecified: Secondary | ICD-10-CM | POA: Diagnosis not present

## 2020-10-06 DIAGNOSIS — D509 Iron deficiency anemia, unspecified: Secondary | ICD-10-CM | POA: Diagnosis not present

## 2020-10-07 DIAGNOSIS — E538 Deficiency of other specified B group vitamins: Secondary | ICD-10-CM | POA: Diagnosis not present

## 2020-10-07 DIAGNOSIS — G3189 Other specified degenerative diseases of nervous system: Secondary | ICD-10-CM | POA: Diagnosis not present

## 2020-10-07 DIAGNOSIS — F0281 Dementia in other diseases classified elsewhere with behavioral disturbance: Secondary | ICD-10-CM | POA: Diagnosis not present

## 2020-10-07 DIAGNOSIS — I712 Thoracic aortic aneurysm, without rupture: Secondary | ICD-10-CM | POA: Diagnosis not present

## 2020-10-07 DIAGNOSIS — D509 Iron deficiency anemia, unspecified: Secondary | ICD-10-CM | POA: Diagnosis not present

## 2020-10-07 DIAGNOSIS — I959 Hypotension, unspecified: Secondary | ICD-10-CM | POA: Diagnosis not present

## 2020-10-13 DIAGNOSIS — E538 Deficiency of other specified B group vitamins: Secondary | ICD-10-CM | POA: Diagnosis not present

## 2020-10-13 DIAGNOSIS — F0281 Dementia in other diseases classified elsewhere with behavioral disturbance: Secondary | ICD-10-CM | POA: Diagnosis not present

## 2020-10-13 DIAGNOSIS — I712 Thoracic aortic aneurysm, without rupture: Secondary | ICD-10-CM | POA: Diagnosis not present

## 2020-10-13 DIAGNOSIS — R4182 Altered mental status, unspecified: Secondary | ICD-10-CM | POA: Diagnosis not present

## 2020-10-13 DIAGNOSIS — R001 Bradycardia, unspecified: Secondary | ICD-10-CM | POA: Diagnosis not present

## 2020-10-13 DIAGNOSIS — D509 Iron deficiency anemia, unspecified: Secondary | ICD-10-CM | POA: Diagnosis not present

## 2020-10-13 DIAGNOSIS — G3189 Other specified degenerative diseases of nervous system: Secondary | ICD-10-CM | POA: Diagnosis not present

## 2020-10-13 DIAGNOSIS — F0391 Unspecified dementia with behavioral disturbance: Secondary | ICD-10-CM | POA: Diagnosis not present

## 2020-10-13 DIAGNOSIS — I959 Hypotension, unspecified: Secondary | ICD-10-CM | POA: Diagnosis not present

## 2020-10-14 DIAGNOSIS — D509 Iron deficiency anemia, unspecified: Secondary | ICD-10-CM | POA: Diagnosis not present

## 2020-10-14 DIAGNOSIS — F0281 Dementia in other diseases classified elsewhere with behavioral disturbance: Secondary | ICD-10-CM | POA: Diagnosis not present

## 2020-10-14 DIAGNOSIS — G3189 Other specified degenerative diseases of nervous system: Secondary | ICD-10-CM | POA: Diagnosis not present

## 2020-10-14 DIAGNOSIS — E538 Deficiency of other specified B group vitamins: Secondary | ICD-10-CM | POA: Diagnosis not present

## 2020-10-14 DIAGNOSIS — F331 Major depressive disorder, recurrent, moderate: Secondary | ICD-10-CM | POA: Diagnosis not present

## 2020-10-14 DIAGNOSIS — F0390 Unspecified dementia without behavioral disturbance: Secondary | ICD-10-CM | POA: Diagnosis not present

## 2020-10-14 DIAGNOSIS — I959 Hypotension, unspecified: Secondary | ICD-10-CM | POA: Diagnosis not present

## 2020-10-14 DIAGNOSIS — I712 Thoracic aortic aneurysm, without rupture: Secondary | ICD-10-CM | POA: Diagnosis not present

## 2020-10-15 DIAGNOSIS — R062 Wheezing: Secondary | ICD-10-CM | POA: Diagnosis not present

## 2020-10-15 DIAGNOSIS — R5381 Other malaise: Secondary | ICD-10-CM | POA: Diagnosis not present

## 2020-10-15 DIAGNOSIS — D509 Iron deficiency anemia, unspecified: Secondary | ICD-10-CM | POA: Diagnosis not present

## 2020-10-15 DIAGNOSIS — G3189 Other specified degenerative diseases of nervous system: Secondary | ICD-10-CM | POA: Diagnosis not present

## 2020-10-15 DIAGNOSIS — I712 Thoracic aortic aneurysm, without rupture: Secondary | ICD-10-CM | POA: Diagnosis not present

## 2020-10-15 DIAGNOSIS — I959 Hypotension, unspecified: Secondary | ICD-10-CM | POA: Diagnosis not present

## 2020-10-15 DIAGNOSIS — F0281 Dementia in other diseases classified elsewhere with behavioral disturbance: Secondary | ICD-10-CM | POA: Diagnosis not present

## 2020-10-15 DIAGNOSIS — E538 Deficiency of other specified B group vitamins: Secondary | ICD-10-CM | POA: Diagnosis not present

## 2020-10-18 DIAGNOSIS — E538 Deficiency of other specified B group vitamins: Secondary | ICD-10-CM | POA: Diagnosis not present

## 2020-10-21 DIAGNOSIS — E119 Type 2 diabetes mellitus without complications: Secondary | ICD-10-CM | POA: Diagnosis not present

## 2020-10-21 DIAGNOSIS — Z79899 Other long term (current) drug therapy: Secondary | ICD-10-CM | POA: Diagnosis not present

## 2020-10-21 DIAGNOSIS — F0391 Unspecified dementia with behavioral disturbance: Secondary | ICD-10-CM | POA: Diagnosis not present

## 2020-10-21 DIAGNOSIS — F17210 Nicotine dependence, cigarettes, uncomplicated: Secondary | ICD-10-CM | POA: Diagnosis not present

## 2020-10-22 DIAGNOSIS — F329 Major depressive disorder, single episode, unspecified: Secondary | ICD-10-CM | POA: Diagnosis not present

## 2020-10-22 DIAGNOSIS — F0390 Unspecified dementia without behavioral disturbance: Secondary | ICD-10-CM | POA: Diagnosis not present

## 2020-10-22 DIAGNOSIS — F59 Unspecified behavioral syndromes associated with physiological disturbances and physical factors: Secondary | ICD-10-CM | POA: Diagnosis not present

## 2020-10-22 DIAGNOSIS — F419 Anxiety disorder, unspecified: Secondary | ICD-10-CM | POA: Diagnosis not present

## 2020-10-22 DIAGNOSIS — F0391 Unspecified dementia with behavioral disturbance: Secondary | ICD-10-CM | POA: Diagnosis not present

## 2020-10-29 DIAGNOSIS — F419 Anxiety disorder, unspecified: Secondary | ICD-10-CM | POA: Diagnosis not present

## 2020-10-29 DIAGNOSIS — F329 Major depressive disorder, single episode, unspecified: Secondary | ICD-10-CM | POA: Diagnosis not present

## 2020-10-30 DIAGNOSIS — F0391 Unspecified dementia with behavioral disturbance: Secondary | ICD-10-CM | POA: Diagnosis not present

## 2020-10-30 DIAGNOSIS — F419 Anxiety disorder, unspecified: Secondary | ICD-10-CM | POA: Diagnosis not present

## 2020-10-30 DIAGNOSIS — F329 Major depressive disorder, single episode, unspecified: Secondary | ICD-10-CM | POA: Diagnosis not present

## 2020-10-30 DIAGNOSIS — F59 Unspecified behavioral syndromes associated with physiological disturbances and physical factors: Secondary | ICD-10-CM | POA: Diagnosis not present

## 2020-11-10 DIAGNOSIS — E559 Vitamin D deficiency, unspecified: Secondary | ICD-10-CM | POA: Diagnosis not present

## 2020-11-10 DIAGNOSIS — F329 Major depressive disorder, single episode, unspecified: Secondary | ICD-10-CM | POA: Diagnosis not present

## 2020-11-10 DIAGNOSIS — K121 Other forms of stomatitis: Secondary | ICD-10-CM | POA: Diagnosis not present

## 2020-11-10 DIAGNOSIS — R600 Localized edema: Secondary | ICD-10-CM | POA: Diagnosis not present

## 2020-11-10 DIAGNOSIS — F0391 Unspecified dementia with behavioral disturbance: Secondary | ICD-10-CM | POA: Diagnosis not present

## 2020-11-10 DIAGNOSIS — F411 Generalized anxiety disorder: Secondary | ICD-10-CM | POA: Diagnosis not present

## 2020-11-13 DIAGNOSIS — R2243 Localized swelling, mass and lump, lower limb, bilateral: Secondary | ICD-10-CM | POA: Diagnosis not present

## 2021-01-14 IMAGING — CT CT HEAD W/O CM
3 of 4 series · 15 of 47 positions shown, 18 images · non-contrast
Comparison: None.

CLINICAL DATA: Memory loss

EXAM:
CT HEAD WITHOUT CONTRAST
TECHNIQUE: Contiguous axial images were obtained from the base of the skull
through the vertex without intravenous contrast.

[Series 2: head · axial · 0.40mm/px · z∈[-534,-409]mm · 9 of 31 slices shown, 12 images (1 of 3)]
[im 3/31  brain]
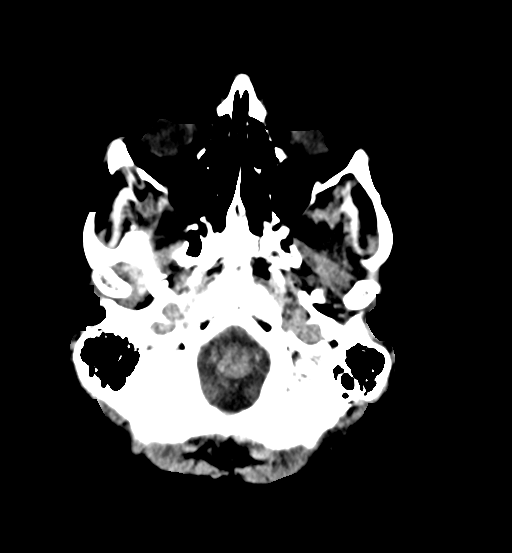
[im 3/31  bone]
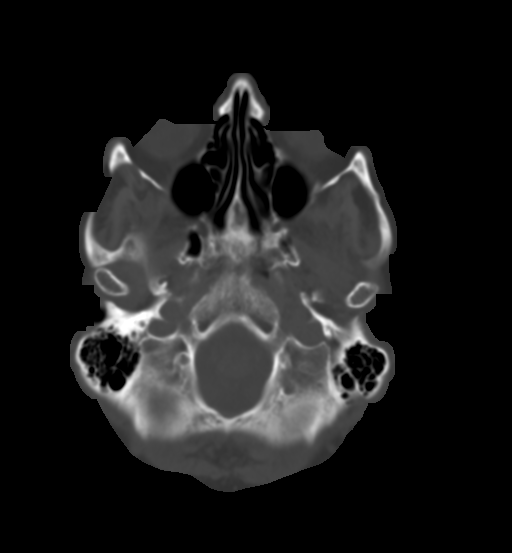
[im 7/31  brain]
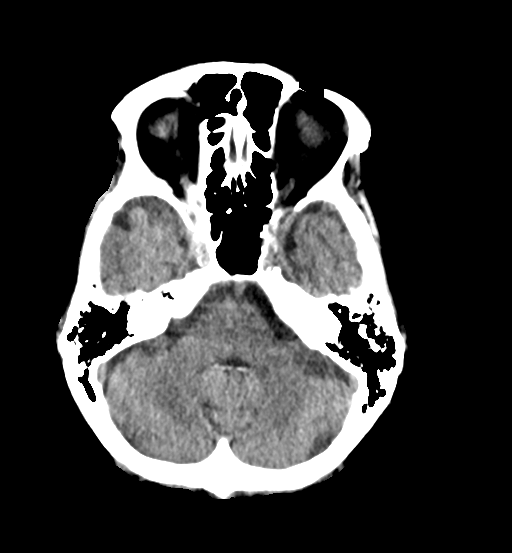
[im 9/31  brain]
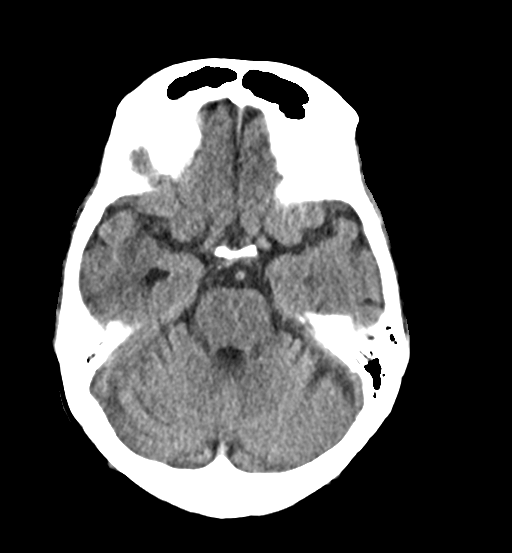
[im 13/31  brain]
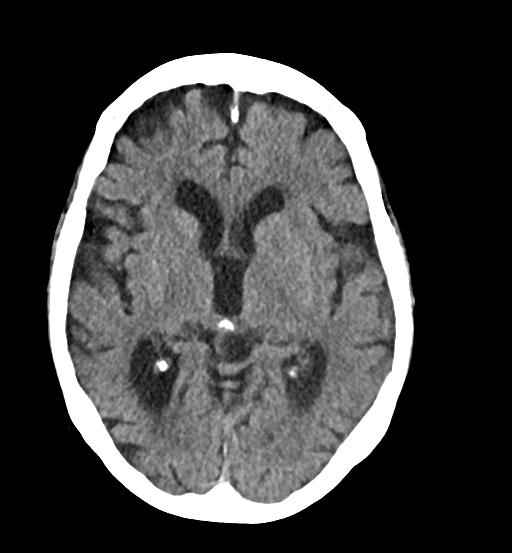
[im 16/31  brain]
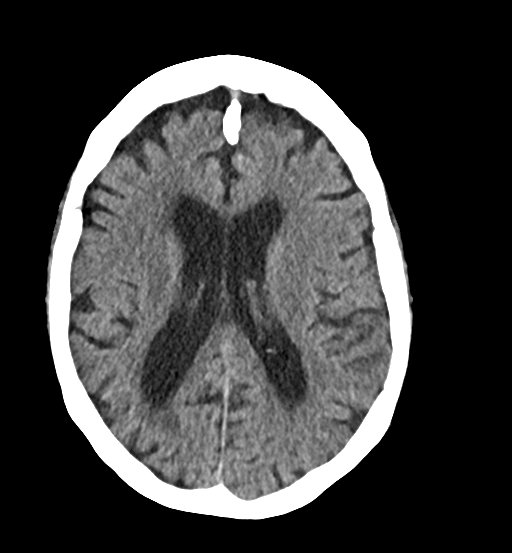
[im 16/31  bone]
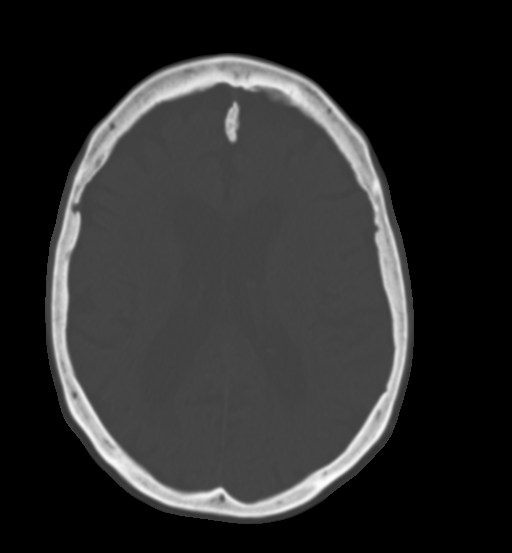
[im 18/31  brain]
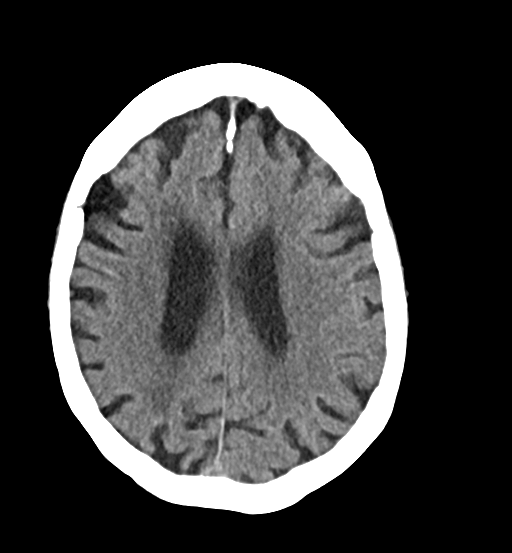
[im 22/31  brain]
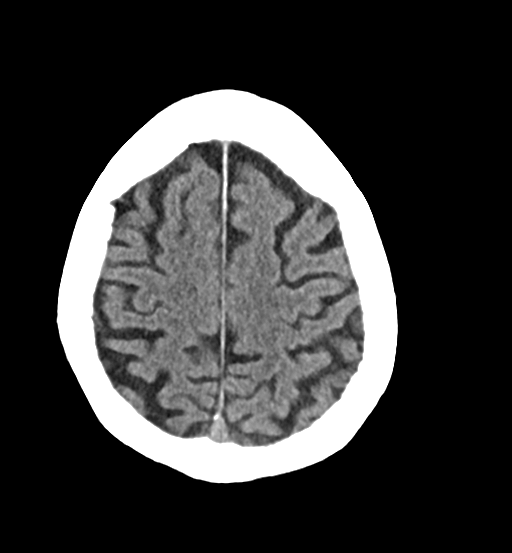
[im 24/31  brain]
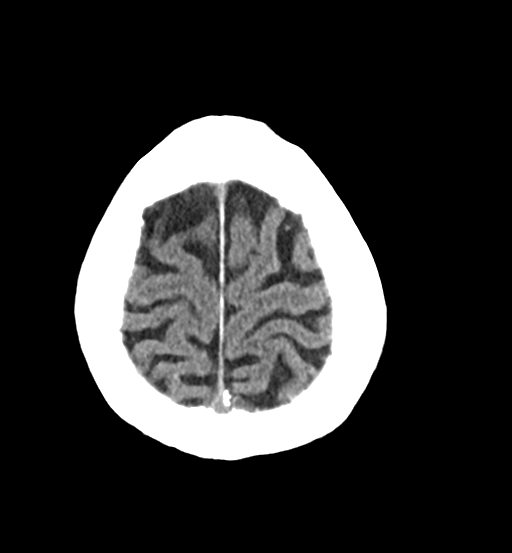
[im 28/31  brain]
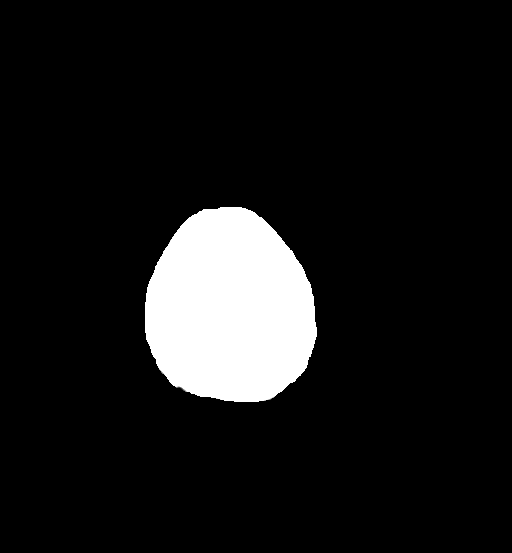
[im 28/31  bone]
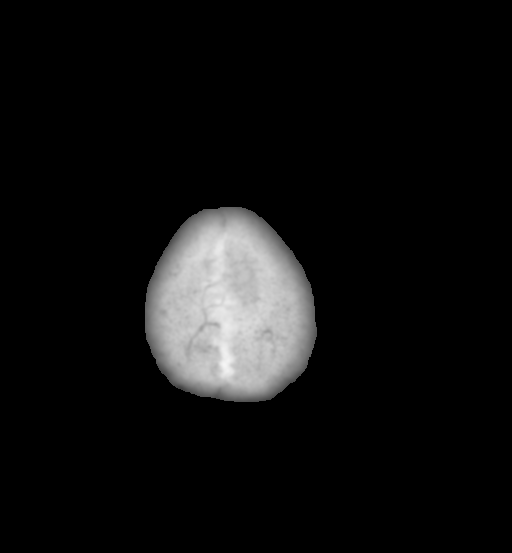

[Series 6: head · coronal · 0.31mm/px · 3 of 70 slices shown (2 of 3)]
[im 24/70  brain]
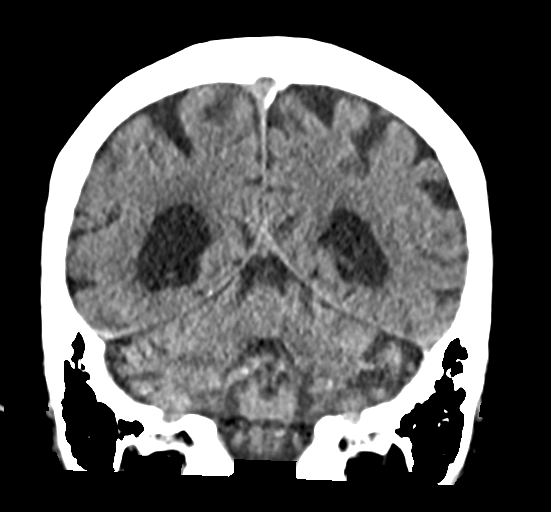
[im 31/70  brain]
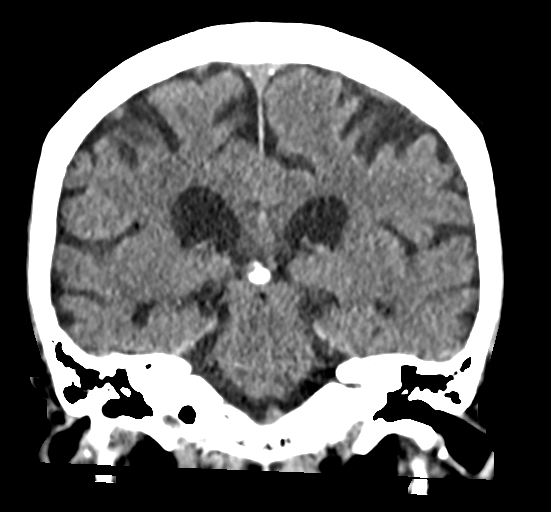
[im 39/70  brain]
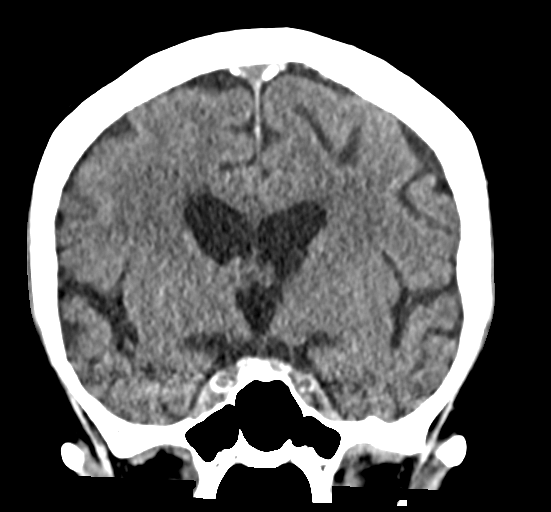

[Series 8: head · sagittal · 0.31mm/px · 3 of 57 slices shown (3 of 3)]
[im 19/57  brain]
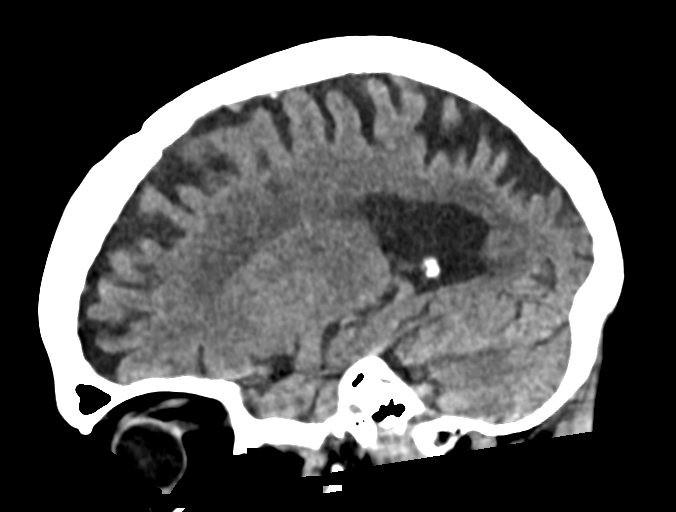
[im 29/57  brain]
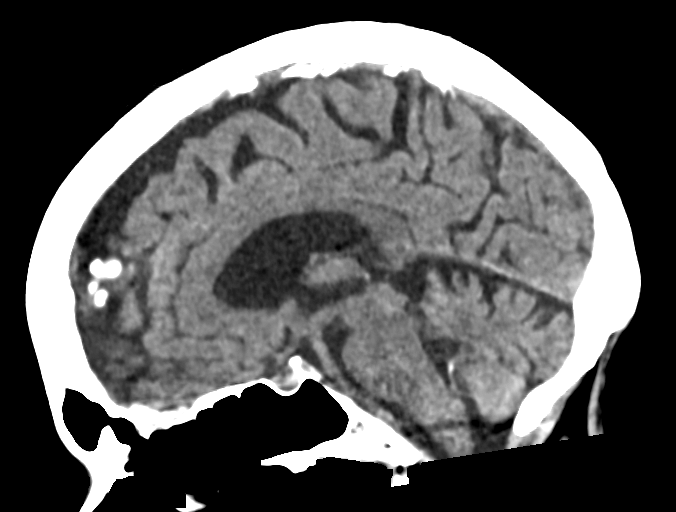
[im 38/57  brain]
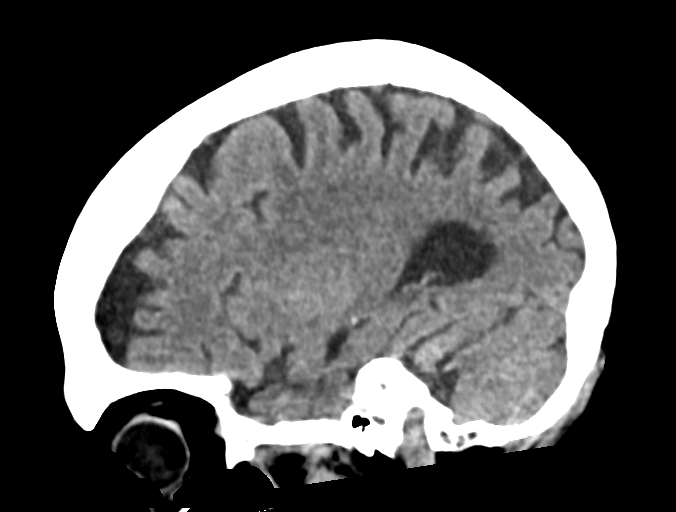

[15 of 47 positions shown; findings below may reference images not displayed]

FINDINGS: Brain: There is moderate generalized atrophy. There is no
intracranial mass, hemorrhage, extra-axial fluid collection, or
midline shift. There is patchy decreased attenuation in the centra
semiovale bilaterally, likely due to small vessel disease. No acute
appearing infarct evident.

Vascular: No evident hyperdense vessel. There is mild arterial
vascular calcification in the carotid siphon regions bilaterally.

Skull: The bony calvarium appears intact.

Sinuses/Orbits: Visualized paranasal sinuses are clear. Orbits
appear symmetric bilaterally.

Other: Visualized mastoid air cells are clear.
IMPRESSION: Atrophy with periventricular small vessel disease. No acute infarct.
No mass or hemorrhage.

There are foci of arterial vascular calcification.

## 2021-11-02 ENCOUNTER — Ambulatory Visit: Payer: Medicare Other | Admitting: Orthopedic Surgery

## 2021-11-06 ENCOUNTER — Ambulatory Visit: Payer: Medicare Other | Admitting: Family

## 2021-11-23 ENCOUNTER — Ambulatory Visit: Payer: Medicare Other | Admitting: Family

## 2021-12-07 ENCOUNTER — Ambulatory Visit (INDEPENDENT_AMBULATORY_CARE_PROVIDER_SITE_OTHER): Payer: Medicare Other | Admitting: Family

## 2021-12-07 ENCOUNTER — Other Ambulatory Visit: Payer: Self-pay

## 2021-12-07 ENCOUNTER — Encounter: Payer: Self-pay | Admitting: Family

## 2021-12-07 VITALS — BP 100/60 | HR 66 | Temp 97.5°F | Ht 68.5 in | Wt 179.4 lb

## 2021-12-07 DIAGNOSIS — Z1231 Encounter for screening mammogram for malignant neoplasm of breast: Secondary | ICD-10-CM

## 2021-12-07 DIAGNOSIS — E538 Deficiency of other specified B group vitamins: Secondary | ICD-10-CM

## 2021-12-07 DIAGNOSIS — I951 Orthostatic hypotension: Secondary | ICD-10-CM

## 2021-12-07 DIAGNOSIS — M810 Age-related osteoporosis without current pathological fracture: Secondary | ICD-10-CM

## 2021-12-07 DIAGNOSIS — E119 Type 2 diabetes mellitus without complications: Secondary | ICD-10-CM

## 2021-12-07 DIAGNOSIS — F609 Personality disorder, unspecified: Secondary | ICD-10-CM

## 2021-12-07 DIAGNOSIS — F0390 Unspecified dementia without behavioral disturbance: Secondary | ICD-10-CM

## 2021-12-07 DIAGNOSIS — Z7689 Persons encountering health services in other specified circumstances: Secondary | ICD-10-CM

## 2021-12-07 DIAGNOSIS — E559 Vitamin D deficiency, unspecified: Secondary | ICD-10-CM

## 2021-12-07 DIAGNOSIS — F418 Other specified anxiety disorders: Secondary | ICD-10-CM

## 2021-12-07 DIAGNOSIS — H8103 Meniere's disease, bilateral: Secondary | ICD-10-CM | POA: Diagnosis not present

## 2021-12-07 NOTE — Progress Notes (Signed)
Provider: Logon Uttech FNP-C   Elby Beck, FNP  Patient Care Team: Elby Beck, FNP as PCP - General (Nurse Practitioner)  Extended Emergency Contact Information Primary Emergency Contact: hazard,robert Mobile Phone: [phone] Relation: Son  Code Status:  Full Code  Goals of care: Advanced Directive information Advanced Directives 12/07/2021  Does Patient Have a Medical Advance Directive? No  Would patient like information on creating a medical advance directive? -     Chief Complaint  Patient presents with   Establish Care    New patient to establish care. Currently in assisted Living at [facility]. Son would just like to discuss overall health and make sure receiving correct level of care for her needs.     HPI:  Pt is a 76 y.o. female seen today for to establish care here at [facility] [place] for medical management of chronic diseases.she is here with son Herbie [place] Hazard who takes care of her finance.states has other siblings who take care of her other responsibilities.States resides at an Assisted Living facility in [place] states likes the facility where she is staying has friends and has lots of activities they do.she enjoys it.she gets assistance with her ADL's due to her memory loss.  She walks with a Rolator.Has had no fall episodes.Son states likes the facility but does not think she gets regular routine medical  visits with provider. Has a medical history of Dementia without any behavioral disturbance, diet Controlled type 2 Diabetes Mellitus,chronic Orthostatic hypotension,Dizziness /Meniere's disease,Wet Macular degeneration ,  Had a cologuard son will bring records. Has no immunization records.     Past Medical History:  Diagnosis Date   History of diabetes mellitus    resolved with weight loss   History of UTI    Prolapsed uterus    Past Surgical History:  Procedure Laterality Date   [place]   GASTRIC BYPASS     VAGINAL HYSTERECTOMY  1980    No Known Allergies  Allergies as of 12/07/2021   No Known Allergies      Medication List        Accurate as of December 07, 2021 10:52 AM. If you have any questions, ask your nurse or doctor.          STOP taking these medications    donepezil 5 MG tablet Commonly known as: ARICEPT Stopped by: Sandrea Hughs, NP   meclizine 25 MG tablet Commonly known as: ANTIVERT Stopped by: Sandrea Hughs, NP   mirtazapine 15 MG tablet Commonly known as: REMERON Stopped by: Sandrea Hughs, NP       TAKE these medications    cyanocobalamin 1000 MCG/ML injection Commonly known as: (VITAMIN B-12) Inject into the muscle.   ferrous sulfate 325 (65 FE) MG tablet Take 325 mg by mouth daily with breakfast.   OLANZapine 5 MG tablet Commonly known as: ZYPREXA Take 5 mg by mouth at bedtime.   sertraline 50 MG tablet Commonly known as: ZOLOFT Take 50 mg by mouth daily.   Vitamin D3 1.25 MG (50000 UT) Tabs Take 1 tablet by mouth every 7 (seven) days.        Review of Systems  Constitutional:  Negative for appetite change, chills, fatigue, fever and unexpected weight change.  HENT:  Positive for dental problem. Negative for congestion, ear discharge, ear pain, facial swelling, hearing loss, nosebleeds, postnasal drip, rhinorrhea, sinus pressure, sinus pain, sneezing, sore throat,  tinnitus and trouble swallowing.        Upper dentures bottom broken   Eyes:  Positive for visual disturbance. Negative for pain, discharge, redness and itching.       Hx of macular degeneration had injection but was told resolved   Respiratory:  Negative for cough, chest tightness, shortness of breath and wheezing.   Cardiovascular:  Negative for chest pain, palpitations and leg swelling.  Gastrointestinal:  Negative for abdominal distention, abdominal pain, blood in stool, constipation, diarrhea, nausea and vomiting.   Endocrine: Negative for cold intolerance, heat intolerance, polydipsia, polyphagia and polyuria.  Genitourinary:  Negative for difficulty urinating, dysuria, flank pain, frequency and urgency.  Musculoskeletal:  Positive for gait problem. Negative for arthralgias, back pain, joint swelling, myalgias, neck pain and neck stiffness.  Skin:  Negative for color change, pallor, rash and wound.  Neurological:  Negative for dizziness, syncope, speech difficulty, weakness, light-headedness, numbness and headaches.  Hematological:  Does not bruise/bleed easily.  Psychiatric/Behavioral:  Negative for agitation, behavioral problems, confusion, hallucinations, self-injury, sleep disturbance and suicidal ideas.        Anxiety and depression has improved with living in the ALF    Immunization History  Administered Date(s) Administered   Fluad Quad(high Dose 65+) 08/03/2019   Influenza, High Dose Seasonal PF 07/20/2018   Pneumococcal Conjugate-13 08/03/2019   Pertinent  Health Maintenance Due  Topic Date Due   URINE MICROALBUMIN  Never done   COLONOSCOPY (Pts 45-23yr Insurance coverage will need to be confirmed)  Never done   MAMMOGRAM  08/15/2020   INFLUENZA VACCINE  06/15/2021   DEXA SCAN  Completed   Fall Risk 05/19/2018 07/20/2018 08/01/2019 09/03/2019 12/07/2021  Falls in the past year? No No 1 1 0  Was there an injury with Fall? - - 0 0 0  Fall Risk Category Calculator - - 1 1 0  Fall Risk Category - - Low Low Low  Patient Fall Risk Level - - - - Low fall risk  Patient at Risk for Falls Due to - - Medication side effect;Impaired balance/gait History of fall(s) No Fall Risks  Fall risk Follow up - - Falls evaluation completed;Falls prevention discussed Falls evaluation completed Falls evaluation completed   Functional Status Survey:    Vitals:   12/07/21 1032  BP: 100/60  Pulse: 66  Temp: (!) 97.5 F (36.4 C)  SpO2: 95%  Weight: 179 lb 6.4 oz (81.4 kg)  Height: 5' 8.5" (1.74 m)   Body  mass index is 26.88 kg/m. Physical Exam Vitals reviewed.  Constitutional:      General: She is not in acute distress.    Appearance: Normal appearance. She is overweight. She is not ill-appearing or diaphoretic.  HENT:     Head: Normocephalic.     Right Ear: Tympanic membrane, ear canal and external ear normal. There is no impacted cerumen.     Left Ear: Tympanic membrane, ear canal and external ear normal. There is no impacted cerumen.     Nose: Nose normal. No congestion or rhinorrhea.     Mouth/Throat:     Mouth: Mucous membranes are moist.     Pharynx: Oropharynx is clear. No oropharyngeal exudate or posterior oropharyngeal erythema.  Eyes:     General: No scleral icterus.       Right eye: No discharge.        Left eye: No discharge.     Extraocular Movements: Extraocular movements intact.     Conjunctiva/sclera: Conjunctivae normal.  Pupils: Pupils are equal, round, and reactive to light.  Neck:     Vascular: No carotid bruit.  Cardiovascular:     Rate and Rhythm: Normal rate and regular rhythm.     Pulses: Normal pulses.     Heart sounds: Normal heart sounds. No murmur heard.   No friction rub. No gallop.  Pulmonary:     Effort: Pulmonary effort is normal. No respiratory distress.     Breath sounds: Normal breath sounds. No wheezing, rhonchi or rales.  Chest:     Chest wall: No tenderness.  Abdominal:     General: Bowel sounds are normal. There is no distension.     Palpations: Abdomen is soft. There is no mass.     Tenderness: There is no abdominal tenderness. There is no right CVA tenderness, left CVA tenderness, guarding or rebound.  Musculoskeletal:        General: No swelling or tenderness. Normal range of motion.     Cervical back: Normal range of motion. No rigidity or tenderness.     Right lower leg: No edema.     Left lower leg: No edema.  Lymphadenopathy:     Cervical: No cervical adenopathy.  Skin:    General: Skin is warm and dry.     Coloration:  Skin is not pale.     Findings: No bruising, erythema, lesion or rash.  Neurological:     Mental Status: She is alert and oriented to person, place, and time.     Cranial Nerves: No cranial nerve deficit.     Sensory: No sensory deficit.     Motor: No weakness.     Coordination: Coordination normal.     Gait: Gait normal.  Psychiatric:        Mood and Affect: Mood normal.        Speech: Speech normal.        Behavior: Behavior normal.        Thought Content: Thought content normal.        Cognition and Memory: Memory is impaired.        Judgment: Judgment normal.     Comments:  scored 10/30 on MMSE     Labs reviewed: No results for input(s): NA, K, CL, CO2, GLUCOSE, BUN, CREATININE, CALCIUM, MG, PHOS in the last 8760 hours. No results for input(s): AST, ALT, ALKPHOS, BILITOT, PROT, ALBUMIN in the last 8760 hours. No results for input(s): WBC, NEUTROABS, HGB, HCT, MCV, PLT in the last 8760 hours. Lab Results  Component Value Date   TSH 2.39 05/30/2019   Lab Results  Component Value Date   HGBA1C 6.5 07/20/2018   Lab Results  Component Value Date   CHOL 150 07/20/2018   HDL 48.40 07/20/2018   LDLCALC 79 07/20/2018   TRIG 112.0 07/20/2018   CHOLHDL 3 07/20/2018    Significant Diagnostic Results in last 30 days:  No results found.  Assessment/Plan 1. Encounter to establish care No medical records for review has signed release of medical records from previous PCP then will update records.  2. Orthostatic hypotension Chronic  - encouraged fluid intake  - CBC with Differential/Platelet - CMP with eGFR([facility])  3. Meniere's disease of both ears Stable  4. Breast cancer screening by mammogram Asymptomatic  - MM DIGITAL SCREENING BILATERAL; Future  5. Age-related osteoporosis without current pathological fracture previous bone density reviewed done 08/15/2018 Left Femur Neck T-score -2.7 unclear whether she has tried Bisphosphates such as Fosamax and Reclast.will  order bone  density then discuss treatment.  - DG Bone Density; Future  6. Vitamin D deficiency No latest Vitamin D level  for review  Continue on vitamin D supplement  - Vitamin D, 1,25-dihydroxy  7. Vitamin B12 deficiency Continue on vitamin B 12 monthly injection  - Vitamin B12  8. Type 2 diabetes, diet controlled ([place]) Lab Results  Component Value Date   HGBA1C 6.5 07/20/2018  Diet controlled.  - CBC with Differential/Platelet - CMP with eGFR([facility]) - Lipid Panel - TSH - Hemoglobin A1c - Microalbumin / creatinine urine ratio  9. Depression with anxiety Mood stable  Continue on sertraline - TSH  10. 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 Dementia without behavioral disturbance ([place]) Off donepezil  Scored 10/30 on MMSE severe cognitive impairment   Family/ staff Communication: Reviewed plan of care with patient and son verbalized understanding   Labs/tests ordered:  - CBC with Differential/Platelet - CMP with eGFR([facility]) - Lipid Panel - TSH - Hemoglobin A 1 C - Vitamin D, 1,25-dihydroxy - Vitamin B12 - Microalbumin / creatinine urine ratio - DG Bone Density; Future - MM DIGITAL SCREENING BILATERAL; Future  Next Appointment : 6 months for medical management of chronic issues with Fasting Labs.    Sandrea Hughs, NP

## 2021-12-10 LAB — COMPLETE METABOLIC PANEL WITH GFR
AG Ratio: 1.3 (calc) (ref 1.0–2.5)
ALT: 9 U/L (ref 6–29)
AST: 15 U/L (ref 10–35)
Albumin: 3.8 g/dL (ref 3.6–5.1)
Alkaline phosphatase (APISO): 77 U/L (ref 37–153)
BUN: 16 mg/dL (ref 7–25)
CO2: 31 mmol/L (ref 20–32)
Calcium: 8.8 mg/dL (ref 8.6–10.4)
Chloride: 106 mmol/L (ref 98–110)
Creat: 0.93 mg/dL (ref 0.60–1.00)
Globulin: 2.9 g/dL (calc) (ref 1.9–3.7)
Glucose, Bld: 79 mg/dL (ref 65–99)
Potassium: 4.4 mmol/L (ref 3.5–5.3)
Sodium: 142 mmol/L (ref 135–146)
Total Bilirubin: 0.4 mg/dL (ref 0.2–1.2)
Total Protein: 6.7 g/dL (ref 6.1–8.1)
eGFR: 64 mL/min/{1.73_m2} (ref >=60)

## 2021-12-10 LAB — HEMOGLOBIN A1C
Hgb A1c MFr Bld: 6.1 % of total Hgb — ABNORMAL HIGH (ref <5.7)
Mean Plasma Glucose: 128 mg/dL
eAG (mmol/L): 7.1 mmol/L

## 2021-12-10 LAB — CBC WITH DIFFERENTIAL/PLATELET
Absolute Monocytes: 416 cells/uL (ref 200–950)
Basophils Absolute: 29 cells/uL (ref 0–200)
Basophils Relative: 0.5 %
Eosinophils Absolute: 143 cells/uL (ref 15–500)
Eosinophils Relative: 2.5 %
HCT: 34.6 % — ABNORMAL LOW (ref 35.0–45.0)
Hemoglobin: 11.6 g/dL — ABNORMAL LOW (ref 11.7–15.5)
Lymphs Abs: 1756 cells/uL (ref 850–3900)
MCH: 31.9 pg (ref 27.0–33.0)
MCHC: 33.5 g/dL (ref 32.0–36.0)
MCV: 95.1 fL (ref 80.0–100.0)
MPV: 10.4 fL (ref 7.5–12.5)
Monocytes Relative: 7.3 %
Neutro Abs: 3357 cells/uL (ref 1500–7800)
Neutrophils Relative %: 58.9 %
Platelets: 197 10*3/uL (ref 140–400)
RBC: 3.64 10*6/uL — ABNORMAL LOW (ref 3.80–5.10)
RDW: 12.4 % (ref 11.0–15.0)
Total Lymphocyte: 30.8 %
WBC: 5.7 10*3/uL (ref 3.8–10.8)

## 2021-12-10 LAB — VITAMIN D 1,25 DIHYDROXY
Vitamin D 1, 25 (OH)2 Total: 62 pg/mL (ref 18–72)
Vitamin D2 1, 25 (OH)2: <8 pg/mL
Vitamin D3 1, 25 (OH)2: 62 pg/mL

## 2021-12-10 LAB — LIPID PANEL
Cholesterol: 184 mg/dL (ref <200)
HDL: 43 mg/dL — ABNORMAL LOW (ref >=50)
LDL Cholesterol (Calc): 116 mg/dL (calc) — ABNORMAL HIGH
Non-HDL Cholesterol (Calc): 141 mg/dL (calc) — ABNORMAL HIGH (ref <130)
Total CHOL/HDL Ratio: 4.3 (calc) (ref <5.0)
Triglycerides: 140 mg/dL (ref <150)

## 2021-12-10 LAB — MICROALBUMIN / CREATININE URINE RATIO
Creatinine, Urine: 40 mg/dL (ref 20–275)
Microalb, Ur: <0.2 mg/dL

## 2021-12-10 LAB — VITAMIN B12: Vitamin B-12: 1096 pg/mL (ref 200–1100)

## 2021-12-10 LAB — TSH: TSH: 0.86 mIU/L (ref 0.40–4.50)

## 2021-12-15 ENCOUNTER — Other Ambulatory Visit: Payer: Self-pay

## 2021-12-15 DIAGNOSIS — E119 Type 2 diabetes mellitus without complications: Secondary | ICD-10-CM

## 2021-12-15 DIAGNOSIS — I951 Orthostatic hypotension: Secondary | ICD-10-CM

## 2022-04-07 ENCOUNTER — Ambulatory Visit: Payer: Medicare Other | Admitting: Family

## 2022-04-07 ENCOUNTER — Other Ambulatory Visit: Payer: Medicare Other

## 2022-04-07 DIAGNOSIS — I951 Orthostatic hypotension: Secondary | ICD-10-CM

## 2022-04-07 DIAGNOSIS — E119 Type 2 diabetes mellitus without complications: Secondary | ICD-10-CM
